# Patient Record
Sex: Male | Born: 1985 | Race: White | Hispanic: No | Marital: Married | Smoking: Never smoker
Health system: Southern US, Community
[De-identification: ages and names within clinical notes are randomized; demographics above are authoritative.]

## PROBLEM LIST (undated history)

## (undated) DIAGNOSIS — Q602 Renal agenesis, unspecified: Secondary | ICD-10-CM

## (undated) DIAGNOSIS — N201 Calculus of ureter: Secondary | ICD-10-CM

## (undated) DIAGNOSIS — K56609 Unspecified intestinal obstruction, unspecified as to partial versus complete obstruction: Secondary | ICD-10-CM

## (undated) DIAGNOSIS — Q600 Renal agenesis, unilateral: Secondary | ICD-10-CM

## (undated) DIAGNOSIS — Z8719 Personal history of other diseases of the digestive system: Secondary | ICD-10-CM

## (undated) DIAGNOSIS — M1990 Unspecified osteoarthritis, unspecified site: Secondary | ICD-10-CM

## (undated) DIAGNOSIS — F419 Anxiety disorder, unspecified: Secondary | ICD-10-CM

## (undated) DIAGNOSIS — Z87898 Personal history of other specified conditions: Secondary | ICD-10-CM

## (undated) DIAGNOSIS — G4733 Obstructive sleep apnea (adult) (pediatric): Secondary | ICD-10-CM

## (undated) HISTORY — PX: NO PAST SURGERIES: SHX2092

## (undated) HISTORY — PX: COLON SURGERY: SHX602

## (undated) HISTORY — DX: Renal agenesis, unspecified: Q60.2

---
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
## 2009-03-14 ENCOUNTER — Emergency Department (HOSPITAL_BASED_OUTPATIENT_CLINIC_OR_DEPARTMENT_OTHER): Admission: EM | Admit: 2009-03-14 | Discharge: 2009-03-14 | Payer: Self-pay | Admitting: Emergency Medicine

## 2010-05-05 LAB — URINALYSIS, ROUTINE W REFLEX MICROSCOPIC
Bilirubin Urine: NEGATIVE
Glucose, UA: NEGATIVE mg/dL
Hgb urine dipstick: NEGATIVE
Ketones, ur: NEGATIVE mg/dL
Nitrite: NEGATIVE
Protein, ur: NEGATIVE mg/dL
Specific Gravity, Urine: 1.019 (ref 1.005–1.030)
Urobilinogen, UA: 1 mg/dL (ref 0.0–1.0)
pH: 6.5 (ref 5.0–8.0)
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
## 2011-10-19 ENCOUNTER — Emergency Department (HOSPITAL_BASED_OUTPATIENT_CLINIC_OR_DEPARTMENT_OTHER): Payer: Managed Care, Other (non HMO)

## 2011-10-19 ENCOUNTER — Emergency Department (HOSPITAL_BASED_OUTPATIENT_CLINIC_OR_DEPARTMENT_OTHER)
Admission: EM | Admit: 2011-10-19 | Discharge: 2011-10-19 | Disposition: A | Discharge status: Home / Self Care | Payer: Managed Care, Other (non HMO) | Attending: Emergency Medicine | Admitting: Emergency Medicine

## 2011-10-19 ENCOUNTER — Encounter (HOSPITAL_BASED_OUTPATIENT_CLINIC_OR_DEPARTMENT_OTHER): Payer: Self-pay | Admitting: Emergency Medicine

## 2011-10-19 DIAGNOSIS — R1011 Right upper quadrant pain: Secondary | ICD-10-CM | POA: Insufficient documentation

## 2011-10-19 DIAGNOSIS — R110 Nausea: Secondary | ICD-10-CM | POA: Insufficient documentation

## 2011-10-19 DIAGNOSIS — R109 Unspecified abdominal pain: Secondary | ICD-10-CM

## 2011-10-19 LAB — COMPREHENSIVE METABOLIC PANEL
ALT: 28 U/L (ref 0–53)
AST: 21 U/L (ref 0–37)
Albumin: 4.5 g/dL (ref 3.5–5.2)
Alkaline Phosphatase: 66 U/L (ref 39–117)
Calcium: 9.7 mg/dL (ref 8.4–10.5)
Potassium: 4.1 mEq/L (ref 3.5–5.1)
Sodium: 139 mEq/L (ref 135–145)
Total Protein: 7.4 g/dL (ref 6.0–8.3)

## 2011-10-19 LAB — CBC WITH DIFFERENTIAL/PLATELET
Basophils Absolute: 0 10*3/uL (ref 0.0–0.1)
Eosinophils Absolute: 0.1 10*3/uL (ref 0.0–0.7)
Eosinophils Relative: 1 % (ref 0–5)
MCH: 31 pg (ref 26.0–34.0)
MCV: 87.8 fL (ref 78.0–100.0)
Neutrophils Relative %: 79 % — ABNORMAL HIGH (ref 43–77)
Platelets: 214 10*3/uL (ref 150–400)
RBC: 4.84 MIL/uL (ref 4.22–5.81)
RDW: 12.1 % (ref 11.5–15.5)
WBC: 9.9 10*3/uL (ref 4.0–10.5)

## 2011-10-19 MED ORDER — GI COCKTAIL ~~LOC~~
30.0000 mL | Freq: Once | ORAL | Status: AC
  Administered 2011-10-19: 30 mL via ORAL
  Filled 2011-10-19: qty 30

## 2011-10-19 MED ORDER — HYDROMORPHONE HCL PF 1 MG/ML IJ SOLN
0.5000 mg | Freq: Once | INTRAMUSCULAR | Status: AC
  Administered 2011-10-19: 1 mg via INTRAVENOUS
  Filled 2011-10-19: qty 1

## 2011-10-19 MED ORDER — ONDANSETRON HCL 4 MG/2ML IJ SOLN
4.0000 mg | Freq: Once | INTRAMUSCULAR | Status: AC
  Administered 2011-10-19: 4 mg via INTRAVENOUS
  Filled 2011-10-19: qty 2

## 2011-10-19 MED ORDER — HYDROMORPHONE HCL PF 1 MG/ML IJ SOLN
0.5000 mg | Freq: Once | INTRAMUSCULAR | Status: AC
  Administered 2011-10-19: 0.5 mg via INTRAVENOUS
  Filled 2011-10-19: qty 1

## 2011-10-19 MED ORDER — OXYCODONE-ACETAMINOPHEN 5-325 MG PO TABS: 1.0000 | ORAL_TABLET | ORAL | Status: AC | PRN

## 2011-10-19 MED ORDER — ONDANSETRON HCL 4 MG PO TABS: 4.0000 mg | ORAL_TABLET | Freq: Four times a day (QID) | ORAL | Status: AC

## 2011-10-19 NOTE — ED Notes (Signed)
RUQ pain intermittently for approximately 2 weeks.  States he has been seen by PMD for this and was not dx'd with anything.  Was put on Nexium but states pain has gotten worse.  Denies dysphagia and reflux.  Admits to early morning pain mostly and today with n/v.

## 2011-10-19 NOTE — ED Notes (Signed)
(+)  murphy sign

## 2011-10-19 NOTE — ED Provider Notes (Signed)
History     CSN: [id number]  Arrival date & time 10/19/11  1027   First MD Initiated Contact with Patient 10/19/11 1208      Chief Complaint  Patient presents with  . Abdominal Pain    (Consider location/radiation/quality/duration/timing/severity/associated sxs/prior treatment) HPI Comments: Patient presents today with the chief complaint of abdominal pain. He states the pain began about a week and a half ago and has gradually gotten worse. He states that the pain will come and go and describes it as sharp. He reports a small amount of diarrhea this morning but denies any other change in bowel habits. He denies any vomiting, melena or hematemesis. He saw his PCP Friday for the pain and is awaiting lab results from their office. He has been taking Nexium since that visit (2 days) without change in symptoms. He reports the pain is worse in the morning and is not associated with eating. He has a past medical history of colon surgery. He denies any history of ulcers. He denies chest pain, shortness of breath, fever and flank pain.  Patient is a 26 y.o. male presenting with abdominal pain. The history is provided by the patient.  Abdominal Pain The primary symptoms of the illness include abdominal pain and nausea. The primary symptoms of the illness do not include fever, shortness of breath, vomiting, diarrhea, hematemesis or hematochezia. The current episode started more than 2 days ago. The problem has been gradually worsening.  The illness is associated with awakening from sleep. The patient has not had a change in bowel habit. Symptoms associated with the illness do not include chills, anorexia or constipation.    History reviewed. No pertinent past medical history.  Past Surgical History  Procedure Date  . Colon surgery     No family history on file.  History  Substance Use Topics  . Smoking status: Never Smoker   . Smokeless tobacco: Not on file  . Alcohol Use: Yes       Review of Systems  Constitutional: Negative for fever, chills, appetite change and unexpected weight change.  Respiratory: Negative for shortness of breath.   Cardiovascular: Negative for chest pain.  Gastrointestinal: Positive for nausea and abdominal pain. Negative for vomiting, diarrhea, constipation, blood in stool, hematochezia, anorexia and hematemesis.  Genitourinary: Negative for flank pain.    Allergies  Morphine and related  Home Medications   Current Outpatient Rx  Name Route Sig Dispense Refill  . ESOMEPRAZOLE MAGNESIUM 40 MG PO CPDR Oral Take 40 mg by mouth daily before breakfast.      BP 129/84  Pulse 68  Temp 98 F (36.7 C) (Oral)  Resp 16  Ht 5\' 5"  (1.651 m)  Wt 160 lb (72.576 kg)  BMI 26.63 kg/m2  SpO2 98%  Physical Exam  Constitutional: He appears well-developed and well-nourished.  Cardiovascular: Normal rate, regular rhythm, normal heart sounds and intact distal pulses.   Pulmonary/Chest: Effort normal.  Abdominal: Soft. Bowel sounds are normal. There is tenderness in the right upper quadrant, right lower quadrant and epigastric area. There is no rebound.    ED Course  Procedures (including critical care time)   Labs Reviewed  CBC WITH DIFFERENTIAL  COMPREHENSIVE METABOLIC PANEL  LIPASE, BLOOD  URINALYSIS, ROUTINE W REFLEX MICROSCOPIC   Results for orders placed during the hospital encounter of 10/19/11  CBC WITH DIFFERENTIAL      Component Value Range   WBC 9.9  4.0 - 10.5 K/uL   RBC 4.84  4.22 -  5.81 MIL/uL   Hemoglobin 15.0  13.0 - 17.0 g/dL   HCT 16.1  09.6 - 04.5 %   MCV 87.8  78.0 - 100.0 fL   MCH 31.0  26.0 - 34.0 pg   MCHC 35.3  30.0 - 36.0 g/dL   RDW 40.9  81.1 - 91.4 %   Platelets 214  150 - 400 K/uL   Neutrophils Relative 79 (*) 43 - 77 %   Neutro Abs 7.8 (*) 1.7 - 7.7 K/uL   Lymphocytes Relative 15  12 - 46 %   Lymphs Abs 1.5  0.7 - 4.0 K/uL   Monocytes Relative 5  3 - 12 %   Monocytes Absolute 0.5  0.1 - 1.0 K/uL    Eosinophils Relative 1  0 - 5 %   Eosinophils Absolute 0.1  0.0 - 0.7 K/uL   Basophils Relative 0  0 - 1 %   Basophils Absolute 0.0  0.0 - 0.1 K/uL  COMPREHENSIVE METABOLIC PANEL      Component Value Range   Sodium 139  135 - 145 mEq/L   Potassium 4.1  3.5 - 5.1 mEq/L   Chloride 101  96 - 112 mEq/L   CO2 27  19 - 32 mEq/L   Glucose, Bld 98  70 - 99 mg/dL   BUN 15  6 - 23 mg/dL   Creatinine, Ser 7.82  0.50 - 1.35 mg/dL   Calcium 9.7  8.4 - 95.6 mg/dL   Total Protein 7.4  6.0 - 8.3 g/dL   Albumin 4.5  3.5 - 5.2 g/dL   AST 21  0 - 37 U/L   ALT 28  0 - 53 U/L   Alkaline Phosphatase 66  39 - 117 U/L   Total Bilirubin 0.5  0.3 - 1.2 mg/dL   GFR calc non Af Amer >90  >90 mL/min   GFR calc Af Amer >90  >90 mL/min  LIPASE, BLOOD      Component Value Range   Lipase 31  11 - 59 U/L   US Abdomen Complete  10/19/2011  *RADIOLOGY REPORT*  Clinical Data:  Right upper quadrant pain.  Congenital absence of the left kidney.  History of colon surgery.  COMPLETE ABDOMINAL ULTRASOUND  Comparison:  None.  Findings:  Gallbladder:  No gallstones, gallbladder wall thickening, or pericholecystic fluid.  Common bile duct:  Suboptimal visualization.  Visualized portion measures 1 mm.  No dilation.  Liver:  No intrahepatic biliary ductal dilation.  No mass lesion. Normal echotexture.  IVC:  Obscured by bowel gas.  Pancreas:  Obscured by bowel gas.  Spleen:  10.3 cm.  Normal echotexture.  Right Kidney:  12.1 cm. Normal echotexture.  Normal central sinus echo complex.  No calculi or hydronephrosis.  Left Kidney:  Congenitally absent.  Abdominal aorta:  Visualized portion measures 21 mm.  IMPRESSION: No acute abnormality.  Congenitally absent left kidney.  Negative for cholelithiasis or cholecystitis.   Original Report Authenticated By: Andreas Newport, M.D.    No results found.   No diagnosis found.    MDM  Patient reports pain improvement pain with dilaudid, still mildly tender to palpation. No abnormal lab  results for CBC,CMP and lipase. No evidence of fever. Ultrasound showed no evidence of cholelithiasis or cholecystitis. VSS, feel stable for discharge. Discussed recheck with primary care this week.        Rodena Medin, PA-C 10/19/11 1434

## 2011-10-20 ENCOUNTER — Ambulatory Visit: Payer: Self-pay | Admitting: Family

## 2011-10-20 ENCOUNTER — Inpatient Hospital Stay (HOSPITAL_BASED_OUTPATIENT_CLINIC_OR_DEPARTMENT_OTHER)
Admission: EM | Admit: 2011-10-20 | Discharge: 2011-10-24 | DRG: 390 | Disposition: A | Discharge status: Home / Self Care | Payer: Managed Care, Other (non HMO) | Attending: General Surgery | Admitting: General Surgery

## 2011-10-20 ENCOUNTER — Emergency Department (HOSPITAL_BASED_OUTPATIENT_CLINIC_OR_DEPARTMENT_OTHER): Payer: Managed Care, Other (non HMO)

## 2011-10-20 ENCOUNTER — Encounter (HOSPITAL_BASED_OUTPATIENT_CLINIC_OR_DEPARTMENT_OTHER): Payer: Self-pay | Admitting: *Deleted

## 2011-10-20 DIAGNOSIS — Z885 Allergy status to narcotic agent status: Secondary | ICD-10-CM

## 2011-10-20 DIAGNOSIS — Z9049 Acquired absence of other specified parts of digestive tract: Secondary | ICD-10-CM

## 2011-10-20 DIAGNOSIS — K56609 Unspecified intestinal obstruction, unspecified as to partial versus complete obstruction: Secondary | ICD-10-CM

## 2011-10-20 HISTORY — DX: Unspecified intestinal obstruction, unspecified as to partial versus complete obstruction: K56.609

## 2011-10-20 LAB — URINALYSIS, ROUTINE W REFLEX MICROSCOPIC
Ketones, ur: 15 mg/dL — AB
Leukocytes, UA: NEGATIVE
Nitrite: NEGATIVE
Protein, ur: NEGATIVE mg/dL
Urobilinogen, UA: 1 mg/dL (ref 0.0–1.0)
pH: 6.5 (ref 5.0–8.0)

## 2011-10-20 LAB — BASIC METABOLIC PANEL
Calcium: 10.2 mg/dL (ref 8.4–10.5)
Creatinine, Ser: 1.1 mg/dL (ref 0.50–1.35)
GFR calc Af Amer: >90 mL/min (ref >90)
GFR calc non Af Amer: >90 mL/min (ref >90)

## 2011-10-20 MED ORDER — PANTOPRAZOLE SODIUM 40 MG IV SOLR: 40.0000 mg | Freq: Every day | INTRAVENOUS | Status: DC

## 2011-10-20 MED ORDER — HYDROMORPHONE HCL PF 1 MG/ML IJ SOLN
0.5000 mg | Freq: Once | INTRAMUSCULAR | Status: AC
  Administered 2011-10-20: 0.5 mg via INTRAVENOUS
  Filled 2011-10-20: qty 1

## 2011-10-20 MED ORDER — HYDROMORPHONE HCL PF 1 MG/ML IJ SOLN
0.5000 mg | INTRAMUSCULAR | Status: DC | PRN
  Administered 2011-10-21 – 2011-10-22 (×6): 0.5 mg via INTRAVENOUS
  Filled 2011-10-20 (×6): qty 1

## 2011-10-20 MED ORDER — LIDOCAINE VISCOUS 2 % MT SOLN
20.0000 mL | Freq: Once | OROMUCOSAL | Status: AC
  Administered 2011-10-20: 20 mL via OROMUCOSAL

## 2011-10-20 MED ORDER — ONDANSETRON HCL 4 MG/2ML IJ SOLN
4.0000 mg | Freq: Four times a day (QID) | INTRAMUSCULAR | Status: DC | PRN
  Administered 2011-10-21 (×2): 4 mg via INTRAVENOUS
  Filled 2011-10-20 (×2): qty 2

## 2011-10-20 MED ORDER — LIDOCAINE VISCOUS 2 % MT SOLN
OROMUCOSAL | Status: AC
  Administered 2011-10-20: 20 mL via OROMUCOSAL
  Filled 2011-10-20: qty 15

## 2011-10-20 MED ORDER — ENOXAPARIN SODIUM 40 MG/0.4ML ~~LOC~~ SOLN
40.0000 mg | SUBCUTANEOUS | Status: DC
  Administered 2011-10-21 – 2011-10-24 (×4): 40 mg via SUBCUTANEOUS
  Filled 2011-10-20 (×5): qty 0.4

## 2011-10-20 MED ORDER — SODIUM CHLORIDE 0.9 % IV BOLUS (SEPSIS)
1000.0000 mL | Freq: Once | INTRAVENOUS | Status: AC
  Administered 2011-10-20: 1000 mL via INTRAVENOUS

## 2011-10-20 MED ORDER — HYDROMORPHONE HCL PF 1 MG/ML IJ SOLN
INTRAMUSCULAR | Status: AC
  Filled 2011-10-20: qty 1

## 2011-10-20 MED ORDER — PANTOPRAZOLE SODIUM 40 MG PO TBEC: 40.0000 mg | DELAYED_RELEASE_TABLET | Freq: Every day | ORAL | Status: DC

## 2011-10-20 MED ORDER — IOHEXOL 300 MG/ML  SOLN
100.0000 mL | Freq: Once | INTRAMUSCULAR | Status: AC | PRN
  Administered 2011-10-20: 100 mL via INTRAVENOUS

## 2011-10-20 MED ORDER — IOHEXOL 300 MG/ML  SOLN: 20.0000 mL | INTRAMUSCULAR | Status: DC

## 2011-10-20 MED ORDER — ONDANSETRON HCL 4 MG PO TABS
4.0000 mg | ORAL_TABLET | Freq: Four times a day (QID) | ORAL | Status: DC
  Administered 2011-10-21: 4 mg via ORAL
  Filled 2011-10-20 (×5): qty 1

## 2011-10-20 MED ORDER — POTASSIUM CHLORIDE 10 MEQ/100ML IV SOLN
10.0000 meq | INTRAVENOUS | Status: AC
  Administered 2011-10-20 – 2011-10-21 (×4): 10 meq via INTRAVENOUS
  Filled 2011-10-20 (×4): qty 100

## 2011-10-20 MED ORDER — KCL IN DEXTROSE-NACL 40-5-0.9 MEQ/L-%-% IV SOLN
INTRAVENOUS | Status: DC
Start: 2011-10-20 — End: 2011-10-24
  Administered 2011-10-21 – 2011-10-24 (×7): via INTRAVENOUS
  Filled 2011-10-20 (×11): qty 1000

## 2011-10-20 MED ORDER — ONDANSETRON HCL 4 MG/2ML IJ SOLN
4.0000 mg | Freq: Once | INTRAMUSCULAR | Status: AC
  Administered 2011-10-20: 4 mg via INTRAVENOUS
  Filled 2011-10-20: qty 2

## 2011-10-20 NOTE — ED Notes (Signed)
Report being called to Patent attorney at [facility]
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
## 2011-10-20 NOTE — ED Notes (Signed)
Pt. Reports last bm was yesterday morning and hard.
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
## 2011-10-20 NOTE — ED Notes (Signed)
Right upper quad pain. Was seen here yesterday for same. Vomiting.

## 2011-10-21 ENCOUNTER — Inpatient Hospital Stay (HOSPITAL_COMMUNITY): Payer: Managed Care, Other (non HMO)

## 2011-10-21 LAB — CBC
HCT: 40.4 % (ref 39.0–52.0)
Hemoglobin: 12.9 g/dL — ABNORMAL LOW (ref 13.0–17.0)
Hemoglobin: 14 g/dL (ref 13.0–17.0)
MCH: 30.1 pg (ref 26.0–34.0)
MCH: 31.3 pg (ref 26.0–34.0)
MCHC: 34.7 g/dL (ref 30.0–36.0)
MCV: 90 fL (ref 78.0–100.0)
MCV: 90.2 fL (ref 78.0–100.0)
RBC: 4.28 MIL/uL (ref 4.22–5.81)
RDW: 12.1 % (ref 11.5–15.5)
WBC: 6.5 10*3/uL (ref 4.0–10.5)

## 2011-10-21 LAB — BASIC METABOLIC PANEL
BUN: 15 mg/dL (ref 6–23)
CO2: 25 mEq/L (ref 19–32)
Chloride: 102 mEq/L (ref 96–112)
Creatinine, Ser: 1.11 mg/dL (ref 0.50–1.35)
Potassium: 4.2 mEq/L (ref 3.5–5.1)

## 2011-10-21 LAB — MAGNESIUM: Magnesium: 2 mg/dL (ref 1.5–2.5)

## 2011-10-21 MED ORDER — ONDANSETRON HCL 4 MG/2ML IJ SOLN: 4.0000 mg | Freq: Four times a day (QID) | INTRAMUSCULAR | Status: DC | PRN

## 2011-10-21 MED ORDER — PANTOPRAZOLE SODIUM 40 MG PO PACK
40.0000 mg | PACK | Freq: Every day | ORAL | Status: DC
  Filled 2011-10-21: qty 20

## 2011-10-21 MED ORDER — PANTOPRAZOLE SODIUM 40 MG IV SOLR
40.0000 mg | Freq: Two times a day (BID) | INTRAVENOUS | Status: DC
  Administered 2011-10-21 – 2011-10-24 (×6): 40 mg via INTRAVENOUS
  Filled 2011-10-21 (×9): qty 40

## 2011-10-21 NOTE — Care Management Note (Signed)
  Page 1 of 1   10/21/2011     11:43:44 AM   CARE MANAGEMENT NOTE 10/21/2011  Patient:  Duane Oneal, Duane Oneal   Account Number:  [id number]  Date Initiated:  10/21/2011  Documentation initiated by:  Ronny Flurry  Subjective/Objective Assessment:   DX: SBO     Action/Plan:   NGT and IVF   Anticipated DC Date:  10/24/2011   Anticipated DC Plan:  HOME/SELF CARE         Choice offered to / List presented to:             Status of service:   Medicare Important Message given?   (If response is "NO", the following Medicare IM given date fields will be blank) Date Medicare IM given:   Date Additional Medicare IM given:    Discharge Disposition:    Per UR Regulation:  Reviewed for med. necessity/level of care/duration of stay  If discussed at Long Length of Stay Meetings, dates discussed:    Comments:

## 2011-10-21 NOTE — Progress Notes (Signed)
Subjective: Patient states he is feeling much better. No nausea, pain improved. +flatus. NG output 700cc bilious. Abd 2view Xray shows contrast media in colon  Objective: Vital signs in last 24 hours: Temp:  [97.6 F (36.4 C)-99.7 F (37.6 C)] 97.8 F (36.6 C) (09/04 0512) Pulse Rate:  [94-118] 94  (09/04 0512) Resp:  [16-24] 18  (09/04 0512) BP: (98-132)/(47-73) 108/58 mmHg (09/04 0512) SpO2:  [93 %-100 %] 97 % (09/04 0512) Weight:  [168 lb (76.204 kg)] 168 lb (76.204 kg) (09/03 2100) Last BM Date: 10/20/11  Intake/Output from previous day: 09/03 0701 - 09/04 0700 In: 1111 [I.V.:618; IV Piggyback:493] Out: 1650 [Urine:950; Emesis/NG output:700] Intake/Output this shift:   Gen: In bed, NAD. Talkative. NGT in place Heart: RRR Lungs: CTAB Laterally Abd: Well healed midline scar. Nontender, does not appear distended. +BS  Lab Results:   Basename 10/21/11 0500 10/21/11 0020  WBC 6.5 9.7  HGB 12.9* 14.0  HCT 38.5* 40.4  PLT 195 201   BMET  Basename 10/21/11 0500 10/21/11 0020 10/20/11 1650  NA 135 -- 138  K 4.2 -- 3.4*  CL 102 -- 99  CO2 25 -- 21  GLUCOSE 116* -- 136*  BUN 15 -- 20  CREATININE 1.11 1.08 --  CALCIUM 8.6 -- 10.2   Studies/Results: US Abdomen Complete  10/19/2011  *RADIOLOGY REPORT*  Clinical Data:  Right upper quadrant pain.  Congenital absence of the left kidney.  History of colon surgery.  COMPLETE ABDOMINAL ULTRASOUND  Comparison:  None.  Findings:  Gallbladder:  No gallstones, gallbladder wall thickening, or pericholecystic fluid.  Common bile duct:  Suboptimal visualization.  Visualized portion measures 1 mm.  No dilation.  Liver:  No intrahepatic biliary ductal dilation.  No mass lesion. Normal echotexture.  IVC:  Obscured by bowel gas.  Pancreas:  Obscured by bowel gas.  Spleen:  10.3 cm.  Normal echotexture.  Right Kidney:  12.1 cm. Normal echotexture.  Normal central sinus echo complex.  No calculi or hydronephrosis.  Left Kidney:  Congenitally  absent.  Abdominal aorta:  Visualized portion measures 21 mm.  IMPRESSION: No acute abnormality.  Congenitally absent left kidney.  Negative for cholelithiasis or cholecystitis.   Original Report Authenticated By: Andreas Newport, M.D.    Ct Abdomen Pelvis W Contrast  10/20/2011  *RADIOLOGY REPORT*  Clinical Data: Constipation.  Abdominal pain with vomiting. Congenital solitary kidney.  History of small bowel and colon surgery 10 years ago.  CT ABDOMEN AND PELVIS WITH CONTRAST  Technique:  Multidetector CT imaging of the abdomen and pelvis was performed following the standard protocol during bolus administration of intravenous contrast.  Contrast: OMNIPAQUE IOHEXOL 300 MG/ML  SOLN  Comparison: None.  Findings: In this patient who has a prior bowel wall surgery, there are dilated small bowel loops involving portions of the mid/distal jejunum and proximal ileum consistent with small bowel obstruction. Cause is indeterminate possibly related to adhesions.  No free intraperitoneal air or free fluid.  Solitary kidney.  No focal hepatic, splenic, pancreatic, right renal or adrenal lesion.  No calcified gallstones.  No abdominal aortic aneurysm.  Kyphosis L2-3 level with irregularity of the L3 superior endplate may be related to degenerative changes/prior trauma.  Infection felt to be a secondary less likely consideration unless of high clinical concern.  If infection were of concern then MR may be considered.  IMPRESSION: In this patient who has a prior bowel wall surgery, there are dilated small bowel loops involving portions of the mid/distal jejunum  and proximal ileum consistent with small bowel obstruction. Cause is indeterminate possibly related to adhesions.  Probable degenerative changes contribute to kyphosis at the L2-3 level as noted above.  Solitary right kidney.  This has been made a PRA call report utilizing dashboard call feature.   Original Report Authenticated By: Fuller Canada, M.D.    Dg Abd  Portable 2v  10/21/2011  *RADIOLOGY REPORT*  Clinical Data: Small bowel obstruction.  Abdominal pain.  PORTABLE ABDOMEN - 2 VIEW  Comparison: CT 10/20/2011  Findings: Nasogastric tube has its tip just in the stomach.  There are a few mildly dilated contrast and air filled loops of small intestine, but the plain radiographic pattern is not to markedly abnormal.  An anastomotic sutures are noted in the left upper quadrant.  Previously administered oral contrast is now largely present within the colon.  No free air.  IMPRESSION: A few mildly dilated loops of small intestine in the central abdomen.  Previously administered contrast has passed into the colon.   Original Report Authenticated By: Thomasenia Sales, M.D.    Anti-infectives: Anti-infectives    None     Assessment/Plan: 26 yo M p/w SBO s/p bowel resection 10+ years ago  1. Continue NGT to intermittent suction. Monitor output. Will plan to try clamping trials tomorrow if patient continues to feel well 2. Continue NPO except ice chips for now 3. Pain control with Dilaudid while NPO 4. Zofran prn nausea 5. Will check AM labs 6. Dispo pending further clinical improvement, including tolerating PO, pain control and +BM.   LOS: 1 day   [place], AMBER 10/21/2011 He feels much better today.  Dark output from NG but minimal pain, no fevers or leukocytosis.  Will continue nonop management for now.

## 2011-10-21 NOTE — ED Provider Notes (Signed)
Medical screening examination/treatment/procedure(s) were performed by non-physician practitioner and as supervising physician I was immediately available for consultation/collaboration.  Jones Skene, M.D.     Jones Skene, MD 10/21/11 1111

## 2011-10-22 ENCOUNTER — Inpatient Hospital Stay (HOSPITAL_COMMUNITY): Payer: Managed Care, Other (non HMO)

## 2011-10-22 LAB — CBC
HCT: 41.6 % (ref 39.0–52.0)
Hemoglobin: 14.5 g/dL (ref 13.0–17.0)
MCV: 89.3 fL (ref 78.0–100.0)
RDW: 12.2 % (ref 11.5–15.5)
WBC: 6.7 10*3/uL (ref 4.0–10.5)

## 2011-10-22 LAB — COMPREHENSIVE METABOLIC PANEL
Albumin: 3.6 g/dL (ref 3.5–5.2)
Alkaline Phosphatase: 61 U/L (ref 39–117)
BUN: 7 mg/dL (ref 6–23)
Chloride: 100 mEq/L (ref 96–112)
Creatinine, Ser: 1.06 mg/dL (ref 0.50–1.35)
GFR calc Af Amer: >90 mL/min (ref >90)
GFR calc non Af Amer: >90 mL/min (ref >90)
Glucose, Bld: 112 mg/dL — ABNORMAL HIGH (ref 70–99)
Potassium: 4 mEq/L (ref 3.5–5.1)
Total Bilirubin: 0.6 mg/dL (ref 0.3–1.2)

## 2011-10-22 NOTE — Progress Notes (Signed)
He feels better and is now tolerating some clears.  Will try to advance diet as tolerated and see if he continues to improve.

## 2011-10-22 NOTE — Progress Notes (Signed)
Patient ID: Duane Oneal, male   DOB: [birth year], 26 y.o.   MRN: [id number]    Subjective: Pt feels better, denies nausea or vomiting, minimal pain in last 12 hours, passing lots of gas, no bm yet  Objective: Vital signs in last 24 hours: Temp:  [97.4 F (36.3 C)-98.2 F (36.8 C)] 98 F (36.7 C) (09/05 0215) Pulse Rate:  [73-82] 73  (09/05 0215) Resp:  [18-20] 18  (09/05 0215) BP: (116-121)/(59-72) 116/59 mmHg (09/05 0215) SpO2:  [94 %-98 %] 98 % (09/05 0215) Last BM Date: 10/20/11  Intake/Output from previous day: 09/04 0701 - 09/05 0700 In: 1611.7 [I.V.:1611.7] Out: 2750 [Urine:1300; Emesis/NG output:1450] Intake/Output this shift:    Physical Exam: Gen: alwake and alert, NAD, NGT in and on suction Heart: RRR Lungs: CTA bil Abd: soft, nontender, +bs  Lab Results:   Basename 10/22/11 0745 10/21/11 0500  WBC 6.7 6.5  HGB 14.5 12.9*  HCT 41.6 38.5*  PLT 194 195   BMET  Basename 10/22/11 0745 10/21/11 0500  NA 136 135  K 4.0 4.2  CL 100 102  CO2 28 25  GLUCOSE 112* 116*  BUN 7 15  CREATININE 1.06 1.11  CALCIUM 9.3 8.6   Studies/Results: Ct Abdomen Pelvis W Contrast  10/20/2011  *RADIOLOGY REPORT*  Clinical Data: Constipation.  Abdominal pain with vomiting. Congenital solitary kidney.  History of small bowel and colon surgery 10 years ago.  CT ABDOMEN AND PELVIS WITH CONTRAST  Technique:  Multidetector CT imaging of the abdomen and pelvis was performed following the standard protocol during bolus administration of intravenous contrast.  Contrast: OMNIPAQUE IOHEXOL 300 MG/ML  SOLN  Comparison: None.  Findings: In this patient who has a prior bowel wall surgery, there are dilated small bowel loops involving portions of the mid/distal jejunum and proximal ileum consistent with small bowel obstruction. Cause is indeterminate possibly related to adhesions.  No free intraperitoneal air or free fluid.  Solitary kidney.  No focal hepatic, splenic, pancreatic, right renal  or adrenal lesion.  No calcified gallstones.  No abdominal aortic aneurysm.  Kyphosis L2-3 level with irregularity of the L3 superior endplate may be related to degenerative changes/prior trauma.  Infection felt to be a secondary less likely consideration unless of high clinical concern.  If infection were of concern then MR may be considered.  IMPRESSION: In this patient who has a prior bowel wall surgery, there are dilated small bowel loops involving portions of the mid/distal jejunum and proximal ileum consistent with small bowel obstruction. Cause is indeterminate possibly related to adhesions.  Probable degenerative changes contribute to kyphosis at the L2-3 level as noted above.  Solitary right kidney.  This has been made a PRA call report utilizing dashboard call feature.   Original Report Authenticated By: Fuller Canada, M.D.    Dg Abd 2 Views  10/22/2011  *RADIOLOGY REPORT*  Clinical Data: Follow-up small bowel obstruction.  Some abdominal pain  ABDOMEN - 2 VIEW  Comparison: 10/21/2011  Findings: The nasogastric tube is in place with the tip overlying the mid body of the stomach. Anastomotic suture lines are identified in both upper mid quadrants.  No visualized small bowel gas is noted with gas identified throughout the colon to the level of the rectum. New gas is identified in the region of the cecum and may represent progression of previously noted small bowel gas. Colonic contrast remains in the descending and rectosigmoid portions of the colon.  IMPRESSION: No gas-filled small bowel loops are  seen to suggest residual ileus or small bowel obstruction.   Original Report Authenticated By: Bertha Stakes, M.D.    Dg Abd Portable 2v  10/21/2011  *RADIOLOGY REPORT*  Clinical Data: Small bowel obstruction.  Abdominal pain.  PORTABLE ABDOMEN - 2 VIEW  Comparison: CT 10/20/2011  Findings: Nasogastric tube has its tip just in the stomach.  There are a few mildly dilated contrast and air filled loops of  small intestine, but the plain radiographic pattern is not to markedly abnormal.  An anastomotic sutures are noted in the left upper quadrant.  Previously administered oral contrast is now largely present within the colon.  No free air.  IMPRESSION: A few mildly dilated loops of small intestine in the central abdomen.  Previously administered contrast has passed into the colon.   Original Report Authenticated By: Thomasenia Sales, M.D.    Anti-infectives: Anti-infectives    None     Assessment/Plan: 26 yo M p/w SBO s/p bowel resection 10+ years ago  1. Abd x-ray today shows no ileus or SBO- seems to be resolved, will clamp NGT and give fluids then plan to d/c it later today.  If doing well can likely go home in am.  Repeat films in AM.   LOS: 2 days   [name] 10/22/2011

## 2011-10-23 NOTE — ED Provider Notes (Signed)
Medical screening examination/treatment/procedure(s) were performed by non-physician practitioner and as supervising physician I was immediately available for consultation/collaboration.   Jovonda Selner M Luma Clopper, MD 10/23/11 2202 

## 2011-10-23 NOTE — Progress Notes (Signed)
Tolerating diet and feeling better.  Should be okay for discharge soon

## 2011-10-23 NOTE — Progress Notes (Signed)
Cosigned for [employer] RN assessment, I/O, note(s), med administration and care plan/education.  Lorretta Harp RN

## 2011-10-23 NOTE — Discharge Summary (Signed)
  Physician Discharge Summary  Patient ID: Duane Oneal MRN: [id number] DOB/AGE: 1986/02/11 26 y.o.  Admit date: 10/20/2011 Discharge date: 10/23/2011  Admitting Diagnosis: SBO  Discharge Diagnosis There are no active problems to display for this patient.   Consultants None  Procedures None  Hospital Course:  26 yr old male who presented to [facility] with abdominal pain, nausea, and vomiting.  Work-up showed a SBO.  He was admitted and a NGT was placed.  He was NPO for 24 hours and began to feel better.  His abdominal films also improved and the NGT was clamped and then removed when he tolerate clamping for 4 hours.  His diet was advanced as tolerated and he tolerated this well.  He was stable for discharge.   Medication List  As of 10/23/2011  9:37 AM   ASK your doctor about these medications         esomeprazole 40 MG capsule   Commonly known as: NEXIUM   Inject 40 mg into the vein daily before breakfast.      ondansetron 4 MG tablet   Commonly known as: ZOFRAN   Take 1 tablet (4 mg total) by mouth every 6 (six) hours.      oxyCODONE-acetaminophen 5-325 MG per tablet   Commonly known as: PERCOCET/ROXICET   Take 1 tablet by mouth every 4 (four) hours as needed for pain.             Follow-up Information    Please follow up. (As needed)          Signed: Clance Boll, [facility] [phone]  10/23/2011, 9:37 AM

## 2011-10-23 NOTE — Discharge Instructions (Signed)
Intestinal Obstruction An intestinal obstruction comes from a physical blockage in the bowel. Different problems in the bowel may cause these blockages.  CAUSES   Adhesions from previous surgeries   Cancer or tumor   A hernia, which is a condition in which a portion of the bowel bulges out through an opening or weakness in the abdomen (belly). This sometimes squeezes the bowel.   A swallowed object.   Blockage (impaction) with worms is common in third world countries.   A twisting of the bowel or telescoping of a portion of the bowel into another portion (intussusceptions)   Anything that stops food from going through from the stomach to the anus.  SYMPTOMS  Symptoms of bowel obstruction may result in your belly getting bigger (bloating), nausea, vomiting, explosive diarrhea, explosive stool. You may not be able to hear your normal bowel sounds (such as "growling in your stomach"). You may also stop having bowel movements or passing gas. DIAGNOSIS  Usually this condition is diagnosed with a history and an examination. Often, lab studies (blood work) and x-rays may be used to find the cause. TREATMENT  The main treatment of this condition is to rest the intestine (gut). Often times the obstruction may relieve itself and allow the gut to start working again. Think of the gut like a balloon that is blown up (filled with trapped food and water) which has squeezed into a hole or area which it cannot get through.   If the obstruction is complete, an NG tube (nasogastric) is passed through the nose and into the stomach. It is then connected to suction to keep the stomach emptied out. This also helps treat the nausea and vomiting.   If there is an imbalance in the electrolytes, they are corrected with intravenous fluids. These have the proper chemicals in them to correct the problem.   If the reason for the obstruction (blockage) does not get better with conservative (non-surgical) treatment,  surgery may be necessary. Sometimes, surgery is done immediately if your surgeon knows that the problem is not going to get better with conservative treatment.  PROGNOSIS  Depending on what the problem is, most of these problems can be treated by your caregivers with good results. Your surgeon will discuss the best course of action to take, with you or your loved ones. FOLLOWING SURGERY Seek immediate medical attention if you have:  Increasing abdominal pain, repeated vomiting, dehydration or fainting.   Severe weakness, chest pain or back pain.   Blood in your vomit, stool or you have tarry stool.  Document Released: 04/25/2003 Document Revised: 01/22/2011 Document Reviewed: 09/23/2007 Exit[employer] Patient Information 2012 [employer]. 

## 2011-10-23 NOTE — Progress Notes (Signed)
Patient ID: Duane Oneal, male   DOB: Sep 14, 1985, 26 y.o.   MRN: [id number]  Subjective: Pt doing well, denies pain, nausea, vomiting, feels hungry.  Objective: Vital signs in last 24 hours: Temp:  [97.5 F (36.4 C)-97.6 F (36.4 C)] 97.5 F (36.4 C) (09/06 0639) Pulse Rate:  [57-76] 57  (09/06 0639) Resp:  [20] 20  (09/06 0639) BP: (95-126)/(53-70) 101/53 mmHg (09/06 0639) SpO2:  [98 %-99 %] 99 % (09/06 0639) Last BM Date: November 13, 2011  Intake/Output from previous day: November 13, 2022 0701 - 09/06 0700 In: 1631.7 [I.V.:1631.7] Out: 400 [Urine:400] Intake/Output this shift:    Physical Exam: Gen: alwake and alert, NAD,  Heart: RRR Lungs: CTA bil Abd: soft, nontender, +bs  Lab Results:   Basename Nov 13, 2011 0745 10/21/11 0500  WBC 6.7 6.5  HGB 14.5 12.9*  HCT 41.6 38.5*  PLT 194 195   BMET  Basename 11/13/11 0745 10/21/11 0500  NA 136 135  K 4.0 4.2  CL 100 102  CO2 28 25  GLUCOSE 112* 116*  BUN 7 15  CREATININE 1.06 1.11  CALCIUM 9.3 8.6   Studies/Results: Dg Abd 2 Views  11/13/11  *RADIOLOGY REPORT*  Clinical Data: Follow-up small bowel obstruction.  Some abdominal pain  ABDOMEN - 2 VIEW  Comparison: 10/21/2011  Findings: The nasogastric tube is in place with the tip overlying the mid body of the stomach. Anastomotic suture lines are identified in both upper mid quadrants.  No visualized small bowel gas is noted with gas identified throughout the colon to the level of the rectum. New gas is identified in the region of the cecum and may represent progression of previously noted small bowel gas. Colonic contrast remains in the descending and rectosigmoid portions of the colon.  IMPRESSION: No gas-filled small bowel loops are seen to suggest residual ileus or small bowel obstruction.   Original Report Authenticated By: Bertha Stakes, M.D.    Anti-infectives: Anti-infectives    None     Assessment/Plan: 26 yo M p/w SBO s/p bowel resection 10+ years ago  1. Seems to be  resolved, advance diet as tolerated, likely d/c tomorrow if tolerating diet   LOS: 3 days   Haili Donofrio 10/23/2011

## 2011-10-24 ENCOUNTER — Encounter (HOSPITAL_COMMUNITY): Payer: Self-pay | Admitting: Surgery

## 2011-10-24 DIAGNOSIS — K56609 Unspecified intestinal obstruction, unspecified as to partial versus complete obstruction: Secondary | ICD-10-CM

## 2011-10-24 HISTORY — DX: Unspecified intestinal obstruction, unspecified as to partial versus complete obstruction: K56.609

## 2011-10-24 NOTE — Progress Notes (Signed)
  Subjective: He feels well and ready to go home. No nausea. Tolerating diet. Bowels working. Minimal pain.  Objective: Vital signs in last 24 hours: Temp:  [97.2 F (36.2 C)-98.4 F (36.9 C)] 97.2 F (36.2 C) (09/07 0544) Pulse Rate:  [50-69] 50  (09/07 0544) Resp:  [18-20] 18  (09/07 0544) BP: (120-127)/(49-64) 120/49 mmHg (09/07 0544) SpO2:  [99 %-100 %] 100 % (09/07 0544)   Intake/Output from previous day: 09/06 0701 - 09/07 0700 In: 1968.3 [P.O.:720; I.V.:1248.3] Out: -  Intake/Output this shift: Total I/O In: 647 [I.V.:647] Out: -    General appearance: alert, cooperative and no distress Resp: clear to auscultation bilaterally Cardio: regular rate and rhythm, S1, S2 normal, no murmur, click, rub or gallop GI: soft, non-tender; bowel sounds normal; no masses,  no organomegaly  Incision: No incision  Lab Results:   Basename 10/22/11 0745  WBC 6.7  HGB 14.5  HCT 41.6  PLT 194   BMET  Basename 10/22/11 0745  NA 136  K 4.0  CL 100  CO2 28  GLUCOSE 112*  BUN 7  CREATININE 1.06  CALCIUM 9.3   PT/INR No results found for this basename: LABPROT:2,INR:2 in the last 72 hours ABG No results found for this basename: PHART:2,PCO2:2,PO2:2,HCO3:2 in the last 72 hours  MEDS, Scheduled    . enoxaparin  40 mg Subcutaneous Q24H  . pantoprazole (PROTONIX) IV  40 mg Intravenous Q12H    Studies/Results: No results found.  Assessment: s/p  Patient Active Problem List  Diagnosis  . Small bowel obstruction    Resolved SBO; ready to discharge  Plan: Discharge   LOS: 4 days     Currie Paris, MD, [facility], [place] [phone]   10/24/2011 10:05 AM

## 2011-12-11 NOTE — ED Provider Notes (Signed)
Medical screening examination/treatment/procedure(s) were performed by non-physician practitioner and as supervising physician I was immediately available for consultation/collaboration.  Jones Skene, M.D.     Jones Skene, MD 12/11/11 1047

## 2012-08-13 ENCOUNTER — Emergency Department (HOSPITAL_BASED_OUTPATIENT_CLINIC_OR_DEPARTMENT_OTHER)
Admission: EM | Admit: 2012-08-13 | Discharge: 2012-08-13 | Disposition: A | Discharge status: Home / Self Care | Payer: Managed Care, Other (non HMO) | Attending: Emergency Medicine | Admitting: Emergency Medicine

## 2012-08-13 ENCOUNTER — Encounter (HOSPITAL_BASED_OUTPATIENT_CLINIC_OR_DEPARTMENT_OTHER): Payer: Self-pay | Admitting: *Deleted

## 2012-08-13 DIAGNOSIS — Z79899 Other long term (current) drug therapy: Secondary | ICD-10-CM | POA: Insufficient documentation

## 2012-08-13 DIAGNOSIS — R21 Rash and other nonspecific skin eruption: Secondary | ICD-10-CM | POA: Insufficient documentation

## 2012-08-13 DIAGNOSIS — L299 Pruritus, unspecified: Secondary | ICD-10-CM | POA: Insufficient documentation

## 2012-08-13 DIAGNOSIS — Z8719 Personal history of other diseases of the digestive system: Secondary | ICD-10-CM | POA: Insufficient documentation

## 2012-08-13 MED ORDER — CLOTRIMAZOLE 1 % EX CREA: TOPICAL_CREAM | CUTANEOUS | Status: DC

## 2012-08-13 NOTE — ED Provider Notes (Signed)
   History    CSN: [id number] Arrival date & time 08/13/12  1204  First MD Initiated Contact with Patient 08/13/12 1218     Chief Complaint  Patient presents with  . Rash   (Consider location/radiation/quality/duration/timing/severity/associated sxs/prior Treatment) HPI Duane Oneal is a 27 y.o. male who presents to ED with complaint of rash and itching to the genital area. States rash started 2 days ago after he shaved. States rash is itchy, denies pain. Recent new sexual partner. States bumps are over the pubic area as well as his penis. Tried I&D ointment this morning with no relief. Denies any fever, chills, no urinary symptoms, no penile discharge. States works in heat and is constantly sweating.   Past Medical History  Diagnosis Date  . Small bowel obstruction 10/24/2011   Past Surgical History  Procedure Laterality Date  . Colon surgery     No family history on file. History  Substance Use Topics  . Smoking status: Never Smoker   . Smokeless tobacco: Not on file  . Alcohol Use: Yes    Review of Systems  Constitutional: Negative for fever and chills.  HENT: Negative for neck pain and neck stiffness.   Genitourinary: Positive for genital sores. Negative for dysuria, discharge, penile swelling, scrotal swelling, penile pain and testicular pain.  Skin: Positive for rash.    Allergies  Morphine and related  Home Medications   Current Outpatient Rx  Name  Route  Sig  Dispense  Refill  . esomeprazole (NEXIUM) 40 MG capsule   Intravenous   Inject 40 mg into the vein daily before breakfast.           BP 130/62  Pulse 90  Temp(Src) 97.8 F (36.6 C) (Oral)  Resp 16  Ht 5\' 6"  (1.676 m)  Wt 170 lb (77.111 kg)  BMI 27.45 kg/m2  SpO2 100% Physical Exam  Vitals reviewed. Constitutional: He appears well-developed and well-nourished. No distress.  Genitourinary:  Erythematous papules with surrounding redness to the shaved pubic area, and shaft of the penis. Normal  scrotum. Rash is non tender. No ulcerations or vesicles. No drainage. No penile discharge.     ED Course  Procedures (including critical care time) Labs Reviewed - No data to display No results found.  1. Rash of perineum     MDM  Pt with itchy papular erythematous rash to the genital area. GC/chlamydia obtained and sent. No vescicles or ulcerations, doubt herpes, doubt syphillis, however, instructed to get full testing done at health department. Suspect heat dermatitis vs tinea cruris. Will start on topical antifungal. Instructed to keep area dry, loose underwear, follow up with PCP.  Filed Vitals:   08/13/12 1212  BP: 130/62  Pulse: 90  Temp: 97.8 F (36.6 C)  TempSrc: Oral  Resp: 16  Height: 5\' 6"  (1.676 m)  Weight: 170 lb (77.111 kg)  SpO2: 100%     Brylyn Novakovich A [place] Zou, PA-C 08/13/12 1302

## 2012-08-13 NOTE — ED Notes (Addendum)
Patient has a rash in his groin area. States that it is itching and raised. Does work outside so hes unsure if it may be a heat rash. Also recently shaved the area and used a different shaving cream.States that he also has a new sex partner.

## 2012-08-13 NOTE — ED Provider Notes (Signed)
Medical screening examination/treatment/procedure(s) were performed by non-physician practitioner and as supervising physician I was immediately available for consultation/collaboration.  Yuleni Burich, MD 08/13/12 1640 

## 2012-08-14 LAB — GC/CHLAMYDIA PROBE AMP: GC Probe RNA: NEGATIVE

## 2012-08-31 ENCOUNTER — Emergency Department (HOSPITAL_BASED_OUTPATIENT_CLINIC_OR_DEPARTMENT_OTHER)
Admission: EM | Admit: 2012-08-31 | Discharge: 2012-08-31 | Disposition: A | Discharge status: Home / Self Care | Payer: Managed Care, Other (non HMO) | Attending: Emergency Medicine | Admitting: Emergency Medicine

## 2012-08-31 ENCOUNTER — Encounter (HOSPITAL_BASED_OUTPATIENT_CLINIC_OR_DEPARTMENT_OTHER): Payer: Self-pay

## 2012-08-31 ENCOUNTER — Emergency Department (HOSPITAL_BASED_OUTPATIENT_CLINIC_OR_DEPARTMENT_OTHER): Payer: Managed Care, Other (non HMO)

## 2012-08-31 DIAGNOSIS — R0602 Shortness of breath: Secondary | ICD-10-CM | POA: Insufficient documentation

## 2012-08-31 DIAGNOSIS — R42 Dizziness and giddiness: Secondary | ICD-10-CM | POA: Insufficient documentation

## 2012-08-31 DIAGNOSIS — R0789 Other chest pain: Secondary | ICD-10-CM | POA: Insufficient documentation

## 2012-08-31 DIAGNOSIS — F411 Generalized anxiety disorder: Secondary | ICD-10-CM | POA: Insufficient documentation

## 2012-08-31 DIAGNOSIS — Z8719 Personal history of other diseases of the digestive system: Secondary | ICD-10-CM | POA: Insufficient documentation

## 2012-08-31 DIAGNOSIS — Z79899 Other long term (current) drug therapy: Secondary | ICD-10-CM | POA: Insufficient documentation

## 2012-08-31 HISTORY — DX: Anxiety disorder, unspecified: F41.9

## 2012-08-31 LAB — COMPREHENSIVE METABOLIC PANEL
ALT: 28 U/L (ref 0–53)
AST: 19 U/L (ref 0–37)
Alkaline Phosphatase: 68 U/L (ref 39–117)
CO2: 24 mEq/L (ref 19–32)
Chloride: 101 mEq/L (ref 96–112)
GFR calc non Af Amer: >90 mL/min (ref >90)
Glucose, Bld: 108 mg/dL — ABNORMAL HIGH (ref 70–99)
Potassium: 3.5 mEq/L (ref 3.5–5.1)
Sodium: 139 mEq/L (ref 135–145)
Total Bilirubin: 1.1 mg/dL (ref 0.3–1.2)

## 2012-08-31 LAB — CBC WITH DIFFERENTIAL/PLATELET
Basophils Absolute: 0 10*3/uL (ref 0.0–0.1)
Lymphocytes Relative: 15 % (ref 12–46)
Lymphs Abs: 1.2 10*3/uL (ref 0.7–4.0)
Neutro Abs: 6.3 10*3/uL (ref 1.7–7.7)
Platelets: 231 10*3/uL (ref 150–400)
RBC: 4.92 MIL/uL (ref 4.22–5.81)
RDW: 12.1 % (ref 11.5–15.5)
WBC: 8.1 10*3/uL (ref 4.0–10.5)

## 2012-08-31 NOTE — ED Notes (Signed)
Pt reports intermittent central chest pain, dizziness, and anxiety  that started this morning at 0500 and continued throughout the morning while at work.  States he has increased stress.

## 2012-08-31 NOTE — ED Provider Notes (Signed)
History    CSN: [id number] Arrival date & time 08/31/12  1027  First MD Initiated Contact with Patient 08/31/12 1102     Chief Complaint  Patient presents with  . Chest Pain   (Consider location/radiation/quality/duration/timing/severity/associated sxs/prior Treatment) HPI Comments: Patient woke from sleep at [facility] with an episode of sharp pain to the center of his chest.  This lasted for a few minutes.  He got ready and went to work, then experienced additional episodes of this.  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  He has felt dizzy and short of breath as well this morning.  He denies any prior cardiac history and denies any recent exertional symptoms.  He has no risk factors.  Patient is a 27 y.o. male presenting with chest pain. The history is provided by the patient.  Chest Pain Pain location:  Substernal area Pain quality: tightness   Pain radiates to:  Does not radiate Pain severity:  Moderate Onset quality:  Sudden Timing:  Intermittent Progression:  Unchanged Chronicity:  New Context: not breathing and no movement   Relieved by:  Nothing Worsened by:  Nothing tried Ineffective treatments:  None tried Associated symptoms: no abdominal pain, no cough and no shortness of breath    Past Medical History  Diagnosis Date  . Small bowel obstruction 10/24/2011  . Anxiety    Past Surgical History  Procedure Laterality Date  . Colon surgery     No family history on file. History  Substance Use Topics  . Smoking status: Never Smoker   . Smokeless tobacco: Not on file  . Alcohol Use: Yes     Comment: occasional    Review of Systems  Respiratory: Negative for cough and shortness of breath.   Cardiovascular: Positive for chest pain.  Gastrointestinal: Negative for abdominal pain.  All other systems reviewed and are negative.    Allergies  Morphine and related  Home Medications   Current Outpatient Rx  Name  Route  Sig  Dispense  Refill   . LORazepam (ATIVAN) 0.5 MG tablet   Oral   Take 0.5 mg by mouth every 6 (six) hours as needed for anxiety.          BP 139/95  Pulse 94  Temp(Src) 98 F (36.7 C) (Oral)  Resp 18  SpO2 100% Physical Exam  Nursing note and vitals reviewed. Constitutional: He is oriented to person, place, and time. He appears well-developed and well-nourished. No distress.  HENT:  Head: Normocephalic and atraumatic.  Mouth/Throat: Oropharynx is clear and moist.  Neck: Normal range of motion. Neck supple.  Cardiovascular: Normal rate, regular rhythm and normal heart sounds.   No murmur heard. Pulmonary/Chest: Effort normal and breath sounds normal. No respiratory distress. He has no wheezes.  Abdominal: Soft. Bowel sounds are normal. He exhibits no distension. There is no tenderness.  Musculoskeletal: Normal range of motion. He exhibits no edema.  Neurological: He is alert and oriented to person, place, and time.  Skin: Skin is warm and dry. He is not diaphoretic.    ED Course  Procedures (including critical care time) Labs Reviewed  CBC WITH DIFFERENTIAL - Abnormal; Notable for the following:    Neutrophils Relative % 78 (*)    All other components within normal limits  COMPREHENSIVE METABOLIC PANEL  TROPONIN I   No results found. No diagnosis found.   Date: 08/31/2012  Rate: 87  Rhythm: normal sinus rhythm  QRS Axis: normal  Intervals: normal  ST/T Wave abnormalities: normal  Conduction Disutrbances:none  Narrative Interpretation:   Old EKG Reviewed: none available    MDM  The patient presents with complaints of chest discomfort that started at [facility].  The symptoms are atypical for cardiac pain and the workup including ekg and troponin and chest xray were unremarkable.  He is feeling better.  I do not feel as though further workup is indicated at this time.  He was advised to rest, follow up with pcp in the next week, return prn if he worsens.   Geoffery Lyons, MD 08/31/12  [phone]

## 2012-08-31 NOTE — ED Notes (Signed)
Patient transported to X-ray 

## 2013-05-04 ENCOUNTER — Emergency Department (HOSPITAL_BASED_OUTPATIENT_CLINIC_OR_DEPARTMENT_OTHER)
Admission: EM | Admit: 2013-05-04 | Discharge: 2013-05-04 | Disposition: A | Discharge status: Home / Self Care | Payer: Managed Care, Other (non HMO) | Attending: Emergency Medicine | Admitting: Emergency Medicine

## 2013-05-04 ENCOUNTER — Emergency Department (HOSPITAL_BASED_OUTPATIENT_CLINIC_OR_DEPARTMENT_OTHER): Payer: Managed Care, Other (non HMO)

## 2013-05-04 ENCOUNTER — Encounter (HOSPITAL_BASED_OUTPATIENT_CLINIC_OR_DEPARTMENT_OTHER): Payer: Self-pay | Admitting: Emergency Medicine

## 2013-05-04 DIAGNOSIS — Z8719 Personal history of other diseases of the digestive system: Secondary | ICD-10-CM | POA: Insufficient documentation

## 2013-05-04 DIAGNOSIS — F411 Generalized anxiety disorder: Secondary | ICD-10-CM | POA: Insufficient documentation

## 2013-05-04 DIAGNOSIS — Z79899 Other long term (current) drug therapy: Secondary | ICD-10-CM | POA: Insufficient documentation

## 2013-05-04 DIAGNOSIS — R0789 Other chest pain: Secondary | ICD-10-CM | POA: Insufficient documentation

## 2013-05-04 DIAGNOSIS — R55 Syncope and collapse: Secondary | ICD-10-CM

## 2013-05-04 LAB — COMPREHENSIVE METABOLIC PANEL
ALBUMIN: 4.9 g/dL (ref 3.5–5.2)
ALK PHOS: 62 U/L (ref 39–117)
ALT: 35 U/L (ref 0–53)
AST: 22 U/L (ref 0–37)
BILIRUBIN TOTAL: 0.6 mg/dL (ref 0.3–1.2)
BUN: 13 mg/dL (ref 6–23)
CHLORIDE: 98 meq/L (ref 96–112)
CO2: 26 mEq/L (ref 19–32)
Calcium: 10.3 mg/dL (ref 8.4–10.5)
Creatinine, Ser: 1 mg/dL (ref 0.50–1.35)
GFR calc Af Amer: >90 mL/min (ref >90)
GFR calc non Af Amer: >90 mL/min (ref >90)
Glucose, Bld: 115 mg/dL — ABNORMAL HIGH (ref 70–99)
POTASSIUM: 3.8 meq/L (ref 3.7–5.3)
SODIUM: 140 meq/L (ref 137–147)
TOTAL PROTEIN: 8.2 g/dL (ref 6.0–8.3)

## 2013-05-04 LAB — CBC WITH DIFFERENTIAL/PLATELET
BASOS PCT: 0 % (ref 0–1)
Basophils Absolute: 0 10*3/uL (ref 0.0–0.1)
EOS ABS: 0.1 10*3/uL (ref 0.0–0.7)
Eosinophils Relative: 1 % (ref 0–5)
HEMATOCRIT: 46.5 % (ref 39.0–52.0)
Hemoglobin: 16.3 g/dL (ref 13.0–17.0)
LYMPHS ABS: 1.6 10*3/uL (ref 0.7–4.0)
Lymphocytes Relative: 14 % (ref 12–46)
MCH: 31.8 pg (ref 26.0–34.0)
MCHC: 35.1 g/dL (ref 30.0–36.0)
MCV: 90.8 fL (ref 78.0–100.0)
MONOS PCT: 5 % (ref 3–12)
Monocytes Absolute: 0.6 10*3/uL (ref 0.1–1.0)
NEUTROS ABS: 9 10*3/uL — AB (ref 1.7–7.7)
NEUTROS PCT: 80 % — AB (ref 43–77)
Platelets: 251 10*3/uL (ref 150–400)
RBC: 5.12 MIL/uL (ref 4.22–5.81)
RDW: 12 % (ref 11.5–15.5)
WBC: 11.3 10*3/uL — ABNORMAL HIGH (ref 4.0–10.5)

## 2013-05-04 LAB — URINALYSIS, ROUTINE W REFLEX MICROSCOPIC
BILIRUBIN URINE: NEGATIVE
GLUCOSE, UA: NEGATIVE mg/dL
HGB URINE DIPSTICK: NEGATIVE
KETONES UR: NEGATIVE mg/dL
Leukocytes, UA: NEGATIVE
Nitrite: NEGATIVE
PH: 6.5 (ref 5.0–8.0)
Protein, ur: NEGATIVE mg/dL
SPECIFIC GRAVITY, URINE: 1.009 (ref 1.005–1.030)
Urobilinogen, UA: 1 mg/dL (ref 0.0–1.0)

## 2013-05-04 LAB — T4, FREE: FREE T4: 1.5 ng/dL (ref 0.80–1.80)

## 2013-05-04 LAB — TROPONIN I: Troponin I: <0.3 ng/mL (ref <0.30)

## 2013-05-04 LAB — TSH: TSH: 1.322 u[IU]/mL (ref 0.350–4.500)

## 2013-05-04 LAB — MAGNESIUM: MAGNESIUM: 2.2 mg/dL (ref 1.5–2.5)

## 2013-05-04 LAB — CBG MONITORING, ED: Glucose-Capillary: 111 mg/dL — ABNORMAL HIGH (ref 70–99)

## 2013-05-04 NOTE — ED Notes (Signed)
Patient in restroom.

## 2013-05-04 NOTE — Discharge Instructions (Signed)
As discussed, it is important that you follow up as soon as possible with your physicians for continued management of your condition.  Please be sure to discuss the need for close followup with our cardiologists via telephone tomorrow due to today's episode of losing consciousness.  If you develop any new, or concerning changes in your condition, please return to the emergency department immediately.

## 2013-05-04 NOTE — ED Provider Notes (Signed)
CSN: [id number]     Arrival date & time 05/04/13  1432 History   First MD Initiated Contact with Patient 05/04/13 1459     Chief Complaint  Patient presents with  . Chest Pain  . Loss of Consciousness     HPI  Patient presents after an episode of chest pain, syncope.  Patient was at rest prior to the onset of a heaviness, anterior chest discomfort, then lost consciousness.  The patient was unconscious for approximately 5 minutes. Upon awakening he had no headache, confusion, disorientation, dyspnea.  He did have persistent mild chest pain. Patient also complains of sharp, stabbing pain in his right proximal anterior thigh, though with no swelling or superficial changes. Patient notes that he has won a prior smoker so, approximately one year ago, when he followed up with primary care, cardiology, with no clear etiology for the syncopal episode. Patient currently denies pain anywhere other than his anterior chest, which is mild, diffuse, anterior.  No dyspnea, no pleuritic or exertional changes in the pain.   Past Medical History  Diagnosis Date  . Small bowel obstruction 10/24/2011  . Anxiety    Past Surgical History  Procedure Laterality Date  . Colon surgery     No family history on file. History  Substance Use Topics  . Smoking status: Never Smoker   . Smokeless tobacco: Not on file  . Alcohol Use: Yes    Review of Systems  Constitutional:       Per HPI, otherwise negative  HENT:       Per HPI, otherwise negative  Respiratory:       Per HPI, otherwise negative  Cardiovascular:       Per HPI, otherwise negative  Gastrointestinal: Negative for vomiting.  Endocrine:       Negative aside from HPI  Genitourinary:       Neg aside from HPI   Musculoskeletal:       Per HPI, otherwise negative  Skin: Negative.   Neurological: Positive for syncope.      Allergies  Morphine and related  Home Medications   Current Outpatient Rx  Name  Route  Sig  Dispense  Refill   . LORazepam (ATIVAN) 0.5 MG tablet   Oral   Take 0.5 mg by mouth every 6 (six) hours as needed for anxiety.          BP 125/78  Pulse 106  Temp(Src) 98.9 F (37.2 C) (Oral)  Resp 16  Ht 5\' 5"  (1.651 m)  Wt 174 lb (78.926 kg)  BMI 28.96 kg/m2  SpO2 100% Physical Exam  Nursing note and vitals reviewed. Constitutional: He is oriented to person, place, and time. He appears well-developed. No distress.  HENT:  Head: Normocephalic and atraumatic.  Eyes: Conjunctivae and EOM are normal.  Cardiovascular: Normal rate and regular rhythm.   Pulmonary/Chest: Effort normal. No stridor. No respiratory distress.  Abdominal: He exhibits no distension.  Musculoskeletal: He exhibits no edema.  No gross deformities of the extremities, no appreciable size difference  Neurological: He is alert and oriented to person, place, and time.  Skin: Skin is warm and dry.  Psychiatric: He has a normal mood and affect.    ED Course  Procedures (including critical care time) Labs Review Labs Reviewed  COMPREHENSIVE METABOLIC PANEL - Abnormal; Notable for the following:    Glucose, Bld 115 (*)    All other components within normal limits  CBC WITH DIFFERENTIAL - Abnormal; Notable for the following:  WBC 11.3 (*)    Neutrophils Relative % 80 (*)    Neutro Abs 9.0 (*)    All other components within normal limits  CBG MONITORING, ED - Abnormal; Notable for the following:    Glucose-Capillary 111 (*)    All other components within normal limits  TROPONIN I  URINALYSIS, ROUTINE W REFLEX MICROSCOPIC  MAGNESIUM  TSH  T4, FREE  POCT CBG (FASTING - GLUCOSE)-MANUAL ENTRY   Imaging Review Dg Chest 2 View  05/04/2013   CLINICAL DATA:  Chest pain.  EXAM: CHEST  2 VIEW  COMPARISON:  08/31/2012  FINDINGS: Normal heart size and mediastinal contours. No acute infiltrate or edema. No effusion or pneumothorax. No acute osseous findings.  IMPRESSION: No active cardiopulmonary disease.   Electronically Signed    By: Tiburcio PeaJonathan  Watts M.D.   On: 05/04/2013 15:54     EKG Interpretation   Date/Time:  Thursday May 04 2013 14:40:05 EDT Ventricular Rate:  107 PR Interval:  140 QRS Duration: 74 QT Interval:  374 QTC Calculation: 499 R Axis:   71 Text Interpretation:  Sinus tachycardia Otherwise normal ECG Sinus  tachycardia Artifact Abnormal ekg Confirmed by Gerhard MunchLOCKWOOD, Zeferino Mounts  MD ([id number])  on 05/04/2013 4:49:30 PM       5:50 PM On exam the patient appears comfortable.  No chest pain.  I discussed all findings with him and his mother.  I discussed possibilities of her inpatient evaluation for syncope or outpatient close followup.  Patient has preference for outpatient followup.  Absent pain, with stable vital signs, this seems reasonable.  I discussed return precautions explicitly with him and his mother.  MDM   Final diagnoses:  Syncope    Patient presents with an episode of syncope.  Patient did have chest pain, but on my exam was awake, alert, in no distress, with no active complaints.  Patient had one prior episode in the distant past, with normal outpatient evaluation.  Patient's evaluation here today was largely reassuring, with no evidence of imminent decompensation, and absent any ongoing chest pain, discharge, per his request to pursue further evaluation and management tomorrow with his physician and cardiology. Explicit return precautions, follow up instructions discussed at length with him and his mother.  Gerhard Munchobert Trejan Buda, MD 05/04/13 [phone]

## 2013-05-04 NOTE — ED Notes (Signed)
C/o CP started while at work-then pain to right lower leg-then passed out-denies CP at this time-NAD

## 2013-05-08 ENCOUNTER — Encounter: Payer: Self-pay | Admitting: Cardiology

## 2013-05-08 ENCOUNTER — Ambulatory Visit (INDEPENDENT_AMBULATORY_CARE_PROVIDER_SITE_OTHER): Payer: Managed Care, Other (non HMO) | Admitting: Cardiology

## 2013-05-08 VITALS — BP 110/82 | HR 80 | Ht 65.0 in | Wt 164.0 lb

## 2013-05-08 DIAGNOSIS — R0789 Other chest pain: Secondary | ICD-10-CM

## 2013-05-08 DIAGNOSIS — R079 Chest pain, unspecified: Secondary | ICD-10-CM

## 2013-05-08 DIAGNOSIS — R55 Syncope and collapse: Secondary | ICD-10-CM
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
## 2013-05-08 NOTE — Patient Instructions (Signed)
The current medical regimen is effective;  continue present plan and medications.  Follow up as needed 

## 2013-05-09 ENCOUNTER — Encounter (HOSPITAL_COMMUNITY): Payer: Self-pay | Admitting: Emergency Medicine

## 2013-05-09 ENCOUNTER — Emergency Department (HOSPITAL_COMMUNITY)
Admission: EM | Admit: 2013-05-09 | Discharge: 2013-05-09 | Disposition: A | Discharge status: Home / Self Care | Payer: Managed Care, Other (non HMO) | Attending: Emergency Medicine | Admitting: Emergency Medicine

## 2013-05-09 DIAGNOSIS — R000 Tachycardia, unspecified: Secondary | ICD-10-CM | POA: Insufficient documentation

## 2013-05-09 DIAGNOSIS — Q602 Renal agenesis, unspecified: Secondary | ICD-10-CM | POA: Insufficient documentation

## 2013-05-09 DIAGNOSIS — Z8719 Personal history of other diseases of the digestive system: Secondary | ICD-10-CM | POA: Insufficient documentation

## 2013-05-09 DIAGNOSIS — Z79899 Other long term (current) drug therapy: Secondary | ICD-10-CM | POA: Insufficient documentation

## 2013-05-09 DIAGNOSIS — T43205A Adverse effect of unspecified antidepressants, initial encounter: Secondary | ICD-10-CM | POA: Insufficient documentation

## 2013-05-09 DIAGNOSIS — F411 Generalized anxiety disorder: Secondary | ICD-10-CM | POA: Insufficient documentation

## 2013-05-09 DIAGNOSIS — Q605 Renal hypoplasia, unspecified: Secondary | ICD-10-CM

## 2013-05-09 DIAGNOSIS — T50905A Adverse effect of unspecified drugs, medicaments and biological substances, initial encounter: Secondary | ICD-10-CM

## 2013-05-09 DIAGNOSIS — R42 Dizziness and giddiness: Secondary | ICD-10-CM | POA: Insufficient documentation

## 2013-05-09 DIAGNOSIS — R002 Palpitations: Secondary | ICD-10-CM | POA: Insufficient documentation

## 2013-05-09 DIAGNOSIS — M79609 Pain in unspecified limb: Secondary | ICD-10-CM | POA: Insufficient documentation

## 2013-05-09 LAB — CBC WITH DIFFERENTIAL/PLATELET
BASOS PCT: 0 % (ref 0–1)
Basophils Absolute: 0 10*3/uL (ref 0.0–0.1)
Eosinophils Absolute: 0.1 10*3/uL (ref 0.0–0.7)
Eosinophils Relative: 1 % (ref 0–5)
HCT: 42.8 % (ref 39.0–52.0)
Hemoglobin: 15.6 g/dL (ref 13.0–17.0)
LYMPHS PCT: 14 % (ref 12–46)
Lymphs Abs: 1 10*3/uL (ref 0.7–4.0)
MCH: 32.4 pg (ref 26.0–34.0)
MCHC: 36.4 g/dL — AB (ref 30.0–36.0)
MCV: 88.8 fL (ref 78.0–100.0)
Monocytes Absolute: 0.3 10*3/uL (ref 0.1–1.0)
Monocytes Relative: 4 % (ref 3–12)
NEUTROS PCT: 80 % — AB (ref 43–77)
Neutro Abs: 5.5 10*3/uL (ref 1.7–7.7)
Platelets: 194 10*3/uL (ref 150–400)
RBC: 4.82 MIL/uL (ref 4.22–5.81)
RDW: 11.8 % (ref 11.5–15.5)
WBC: 6.8 10*3/uL (ref 4.0–10.5)

## 2013-05-09 LAB — COMPREHENSIVE METABOLIC PANEL
ALBUMIN: 4.2 g/dL (ref 3.5–5.2)
ALK PHOS: 55 U/L (ref 39–117)
ALT: 18 U/L (ref 0–53)
AST: 16 U/L (ref 0–37)
BILIRUBIN TOTAL: 0.8 mg/dL (ref 0.3–1.2)
BUN: 18 mg/dL (ref 6–23)
CO2: 25 meq/L (ref 19–32)
Calcium: 9.5 mg/dL (ref 8.4–10.5)
Chloride: 97 mEq/L (ref 96–112)
Creatinine, Ser: 0.98 mg/dL (ref 0.50–1.35)
GFR calc Af Amer: >90 mL/min (ref >90)
GFR calc non Af Amer: >90 mL/min (ref >90)
Glucose, Bld: 177 mg/dL — ABNORMAL HIGH (ref 70–99)
Potassium: 3.5 mEq/L — ABNORMAL LOW (ref 3.7–5.3)
SODIUM: 137 meq/L (ref 137–147)
Total Protein: 7.1 g/dL (ref 6.0–8.3)

## 2013-05-09 LAB — I-STAT TROPONIN, ED: TROPONIN I, POC: 0 ng/mL (ref 0.00–0.08)

## 2013-05-09 NOTE — Discharge Instructions (Signed)
Take your Zoloft in evening, or at night to avoid symptoms during the day.  Stay well hydrated.  If her symptoms persist, call Dr. Antoine PocheHochrein for reevaluation.

## 2013-05-09 NOTE — ED Notes (Signed)
MD at bedside. 
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
## 2013-05-09 NOTE — ED Notes (Signed)
Pt reports being seen at [facility] on Thursday for chest pain. Has been seen by pcp and cardiology since. Went to work today and had onset again of felt like heart was racing, sob and mid chest tightness. ekg done on arrival, airway intact, no acute distress noted.

## 2013-05-10 DIAGNOSIS — R079 Chest pain, unspecified: Secondary | ICD-10-CM | POA: Insufficient documentation

## 2014-06-04 ENCOUNTER — Other Ambulatory Visit: Payer: Self-pay

## 2014-06-04 NOTE — Telephone Encounter (Signed)
error 

## 2014-08-17 ENCOUNTER — Emergency Department (HOSPITAL_BASED_OUTPATIENT_CLINIC_OR_DEPARTMENT_OTHER)
Admission: EM | Admit: 2014-08-17 | Discharge: 2014-08-17 | Disposition: A | Discharge status: Home / Self Care | Payer: Managed Care, Other (non HMO) | Attending: Emergency Medicine | Admitting: Emergency Medicine

## 2014-08-17 ENCOUNTER — Emergency Department (HOSPITAL_BASED_OUTPATIENT_CLINIC_OR_DEPARTMENT_OTHER): Payer: Managed Care, Other (non HMO)

## 2014-08-17 ENCOUNTER — Encounter (HOSPITAL_BASED_OUTPATIENT_CLINIC_OR_DEPARTMENT_OTHER): Payer: Self-pay | Admitting: *Deleted

## 2014-08-17 DIAGNOSIS — Z9049 Acquired absence of other specified parts of digestive tract: Secondary | ICD-10-CM | POA: Insufficient documentation

## 2014-08-17 DIAGNOSIS — Z79899 Other long term (current) drug therapy: Secondary | ICD-10-CM | POA: Insufficient documentation

## 2014-08-17 DIAGNOSIS — R109 Unspecified abdominal pain: Secondary | ICD-10-CM | POA: Insufficient documentation

## 2014-08-17 DIAGNOSIS — R197 Diarrhea, unspecified: Secondary | ICD-10-CM | POA: Diagnosis not present

## 2014-08-17 DIAGNOSIS — R42 Dizziness and giddiness: Secondary | ICD-10-CM | POA: Diagnosis not present

## 2014-08-17 DIAGNOSIS — Z8719 Personal history of other diseases of the digestive system: Secondary | ICD-10-CM | POA: Diagnosis not present

## 2014-08-17 DIAGNOSIS — Q602 Renal agenesis, unspecified: Secondary | ICD-10-CM | POA: Diagnosis not present

## 2014-08-17 LAB — CBC WITH DIFFERENTIAL/PLATELET
Basophils Absolute: 0 10*3/uL (ref 0.0–0.1)
Basophils Relative: 0 % (ref 0–1)
EOS ABS: 0.2 10*3/uL (ref 0.0–0.7)
Eosinophils Relative: 2 % (ref 0–5)
HCT: 43.6 % (ref 39.0–52.0)
Hemoglobin: 15 g/dL (ref 13.0–17.0)
LYMPHS ABS: 1.7 10*3/uL (ref 0.7–4.0)
Lymphocytes Relative: 18 % (ref 12–46)
MCH: 31.3 pg (ref 26.0–34.0)
MCHC: 34.4 g/dL (ref 30.0–36.0)
MCV: 90.8 fL (ref 78.0–100.0)
Monocytes Absolute: 0.5 10*3/uL (ref 0.1–1.0)
Monocytes Relative: 5 % (ref 3–12)
NEUTROS ABS: 6.8 10*3/uL (ref 1.7–7.7)
Neutrophils Relative %: 75 % (ref 43–77)
PLATELETS: 223 10*3/uL (ref 150–400)
RBC: 4.8 MIL/uL (ref 4.22–5.81)
RDW: 12.1 % (ref 11.5–15.5)
WBC: 9.1 10*3/uL (ref 4.0–10.5)

## 2014-08-17 LAB — URINALYSIS, ROUTINE W REFLEX MICROSCOPIC
Bilirubin Urine: NEGATIVE
Glucose, UA: NEGATIVE mg/dL
HGB URINE DIPSTICK: NEGATIVE
Ketones, ur: NEGATIVE mg/dL
Leukocytes, UA: NEGATIVE
Nitrite: NEGATIVE
PROTEIN: NEGATIVE mg/dL
Specific Gravity, Urine: 1.022 (ref 1.005–1.030)
Urobilinogen, UA: 0.2 mg/dL (ref 0.0–1.0)
pH: 6 (ref 5.0–8.0)

## 2014-08-17 LAB — COMPREHENSIVE METABOLIC PANEL
ALBUMIN: 4.5 g/dL (ref 3.5–5.0)
ALT: 19 U/L (ref 17–63)
AST: 19 U/L (ref 15–41)
Alkaline Phosphatase: 53 U/L (ref 38–126)
Anion gap: 7 (ref 5–15)
BUN: 15 mg/dL (ref 6–20)
CHLORIDE: 106 mmol/L (ref 101–111)
CO2: 26 mmol/L (ref 22–32)
CREATININE: 0.97 mg/dL (ref 0.61–1.24)
Calcium: 9.2 mg/dL (ref 8.9–10.3)
Glucose, Bld: 104 mg/dL — ABNORMAL HIGH (ref 65–99)
POTASSIUM: 3.9 mmol/L (ref 3.5–5.1)
Sodium: 139 mmol/L (ref 135–145)
TOTAL PROTEIN: 7.4 g/dL (ref 6.5–8.1)
Total Bilirubin: 0.7 mg/dL (ref 0.3–1.2)

## 2014-08-17 MED ORDER — SODIUM CHLORIDE 0.9 % IV BOLUS (SEPSIS)
1000.0000 mL | Freq: Once | INTRAVENOUS | Status: AC
  Administered 2014-08-17: 1000 mL via INTRAVENOUS
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
## 2014-08-17 NOTE — Discharge Instructions (Signed)

## 2014-08-17 NOTE — ED Notes (Signed)
Watery diarrhea x 3 days. Dizziness.

## 2015-02-22 ENCOUNTER — Encounter (HOSPITAL_BASED_OUTPATIENT_CLINIC_OR_DEPARTMENT_OTHER): Payer: Self-pay

## 2015-02-22 ENCOUNTER — Emergency Department (HOSPITAL_BASED_OUTPATIENT_CLINIC_OR_DEPARTMENT_OTHER): Payer: Managed Care, Other (non HMO)

## 2015-02-22 ENCOUNTER — Emergency Department (HOSPITAL_BASED_OUTPATIENT_CLINIC_OR_DEPARTMENT_OTHER)
Admission: EM | Admit: 2015-02-22 | Discharge: 2015-02-22 | Disposition: A | Discharge status: Home / Self Care | Payer: Managed Care, Other (non HMO) | Attending: Emergency Medicine | Admitting: Emergency Medicine

## 2015-02-22 DIAGNOSIS — Y998 Other external cause status: Secondary | ICD-10-CM | POA: Insufficient documentation

## 2015-02-22 DIAGNOSIS — Y9389 Activity, other specified: Secondary | ICD-10-CM | POA: Insufficient documentation

## 2015-02-22 DIAGNOSIS — Z8719 Personal history of other diseases of the digestive system: Secondary | ICD-10-CM | POA: Insufficient documentation

## 2015-02-22 DIAGNOSIS — S0990XA Unspecified injury of head, initial encounter: Secondary | ICD-10-CM

## 2015-02-22 DIAGNOSIS — Q602 Renal agenesis, unspecified: Secondary | ICD-10-CM | POA: Insufficient documentation

## 2015-02-22 DIAGNOSIS — Y9289 Other specified places as the place of occurrence of the external cause: Secondary | ICD-10-CM | POA: Diagnosis not present

## 2015-02-22 DIAGNOSIS — Z8659 Personal history of other mental and behavioral disorders: Secondary | ICD-10-CM | POA: Diagnosis not present

## 2015-02-22 NOTE — Discharge Instructions (Signed)
Head Injury, Adult °You have a head injury. Headaches and throwing up (vomiting) are common after a head injury. It should be easy to wake up from sleeping. Sometimes you must stay in the hospital. Most problems happen within the first 24 hours. Side effects may occur up to 7-10 days after the injury.  °WHAT ARE THE TYPES OF HEAD INJURIES? °Head injuries can be as minor as a bump. Some head injuries can be more severe. More severe head injuries include: °· A jarring injury to the brain (concussion). °· A bruise of the brain (contusion). This mean there is bleeding in the brain that can cause swelling. °· A cracked skull (skull fracture). °· Bleeding in the brain that collects, clots, and forms a bump (hematoma). °WHEN SHOULD I GET HELP RIGHT AWAY?  °· You are confused or sleepy. °· You cannot be woken up. °· You feel sick to your stomach (nauseous) or keep throwing up (vomiting). °· Your dizziness or unsteadiness is getting worse. °· You have very bad, lasting headaches that are not helped by medicine. Take medicines only as told by your doctor. °· You cannot use your arms or legs like normal. °· You cannot walk. °· You notice changes in the black spots in the center of the colored part of your eye (pupil). °· You have clear or bloody fluid coming from your nose or ears. °· You have trouble seeing. °During the next 24 hours after the injury, you must stay with someone who can watch you. This person should get help right away (call 911 in the [place] if you start to shake and are not able to control it (have seizures), you pass out, or you are unable to wake up. °HOW CAN I PREVENT A HEAD INJURY IN THE FUTURE? °· Wear seat belts. °· Wear a helmet while bike riding and playing sports like football. °· Stay away from dangerous activities around the house. °WHEN CAN I RETURN TO NORMAL ACTIVITIES AND ATHLETICS? °See your doctor before doing these activities. You should not do normal activities or play contact sports until 1  week after the following symptoms have stopped: °· Headache that does not go away. °· Dizziness. °· Poor attention. °· Confusion. °· Memory problems. °· Sickness to your stomach or throwing up. °· Tiredness. °· Fussiness. °· Bothered by bright lights or loud noises. °· Anxiousness or depression. °· Restless sleep. °MAKE SURE YOU:  °· Understand these instructions. °· Will watch your condition. °· Will get help right away if you are not doing well or get worse. °  °This information is not intended to replace advice given to you by your health care provider. Make sure you discuss any questions you have with your health care provider. °  °Document Released: 01/16/2008 Document Revised: 02/23/2014 Document Reviewed: 10/10/2012 °Elsevier Interactive Patient Education ©2016 [employer] ° °
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
## 2020-10-06 ENCOUNTER — Other Ambulatory Visit: Payer: Self-pay

## 2020-10-06 ENCOUNTER — Encounter (HOSPITAL_BASED_OUTPATIENT_CLINIC_OR_DEPARTMENT_OTHER): Payer: Self-pay | Admitting: *Deleted

## 2020-10-06 ENCOUNTER — Inpatient Hospital Stay (HOSPITAL_BASED_OUTPATIENT_CLINIC_OR_DEPARTMENT_OTHER)
Admission: EM | Admit: 2020-10-06 | Discharge: 2020-10-09 | DRG: 389 | Disposition: A | Discharge status: Home / Self Care | Payer: 59 | Attending: Surgery | Admitting: Surgery

## 2020-10-06 DIAGNOSIS — K56609 Unspecified intestinal obstruction, unspecified as to partial versus complete obstruction: Secondary | ICD-10-CM | POA: Diagnosis present

## 2020-10-06 DIAGNOSIS — Q600 Renal agenesis, unilateral: Secondary | ICD-10-CM | POA: Diagnosis not present

## 2020-10-06 DIAGNOSIS — I10 Essential (primary) hypertension: Secondary | ICD-10-CM | POA: Diagnosis present

## 2020-10-06 DIAGNOSIS — Z9049 Acquired absence of other specified parts of digestive tract: Secondary | ICD-10-CM | POA: Diagnosis not present

## 2020-10-06 DIAGNOSIS — Z885 Allergy status to narcotic agent status: Secondary | ICD-10-CM

## 2020-10-06 DIAGNOSIS — Z978 Presence of other specified devices: Secondary | ICD-10-CM

## 2020-10-06 DIAGNOSIS — Z8719 Personal history of other diseases of the digestive system: Secondary | ICD-10-CM

## 2020-10-06 DIAGNOSIS — F419 Anxiety disorder, unspecified: Secondary | ICD-10-CM | POA: Diagnosis present

## 2020-10-06 DIAGNOSIS — Z20822 Contact with and (suspected) exposure to covid-19: Secondary | ICD-10-CM | POA: Diagnosis not present

## 2020-10-06 DIAGNOSIS — Z0189 Encounter for other specified special examinations: Secondary | ICD-10-CM

## 2020-10-06 DIAGNOSIS — K5669 Other partial intestinal obstruction: Secondary | ICD-10-CM | POA: Diagnosis not present

## 2020-10-06 LAB — CBC WITH DIFFERENTIAL/PLATELET
Abs Immature Granulocytes: 0.05 10*3/uL (ref 0.00–0.07)
Basophils Absolute: 0.1 10*3/uL (ref 0.0–0.1)
Basophils Relative: 1 %
Eosinophils Absolute: 0.1 10*3/uL (ref 0.0–0.5)
Eosinophils Relative: 1 %
HCT: 48.5 % (ref 39.0–52.0)
Hemoglobin: 17.4 g/dL — ABNORMAL HIGH (ref 13.0–17.0)
Immature Granulocytes: 0 %
Lymphocytes Relative: 11 %
Lymphs Abs: 1.6 10*3/uL (ref 0.7–4.0)
MCH: 31.7 pg (ref 26.0–34.0)
MCHC: 35.9 g/dL (ref 30.0–36.0)
MCV: 88.3 fL (ref 80.0–100.0)
Monocytes Absolute: 1 10*3/uL (ref 0.1–1.0)
Monocytes Relative: 7 %
Neutro Abs: 11.6 10*3/uL — ABNORMAL HIGH (ref 1.7–7.7)
Neutrophils Relative %: 80 %
Platelets: 286 10*3/uL (ref 150–400)
RBC: 5.49 MIL/uL (ref 4.22–5.81)
RDW: 11.8 % (ref 11.5–15.5)
WBC: 14.5 10*3/uL — ABNORMAL HIGH (ref 4.0–10.5)
nRBC: 0 % (ref 0.0–0.2)

## 2020-10-06 MED ORDER — FENTANYL CITRATE (PF) 100 MCG/2ML IJ SOLN
50.0000 ug | Freq: Once | INTRAMUSCULAR | Status: AC
  Administered 2020-10-07: 50 ug via INTRAVENOUS
  Filled 2020-10-06: qty 2
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
## 2020-10-07 ENCOUNTER — Inpatient Hospital Stay (HOSPITAL_COMMUNITY): Payer: 59

## 2020-10-07 ENCOUNTER — Emergency Department (HOSPITAL_BASED_OUTPATIENT_CLINIC_OR_DEPARTMENT_OTHER): Payer: 59

## 2020-10-07 ENCOUNTER — Encounter (HOSPITAL_BASED_OUTPATIENT_CLINIC_OR_DEPARTMENT_OTHER): Payer: Self-pay | Admitting: Emergency Medicine

## 2020-10-07 DIAGNOSIS — K56609 Unspecified intestinal obstruction, unspecified as to partial versus complete obstruction: Secondary | ICD-10-CM | POA: Diagnosis present

## 2020-10-07 DIAGNOSIS — Z20822 Contact with and (suspected) exposure to covid-19: Secondary | ICD-10-CM | POA: Diagnosis present

## 2020-10-07 DIAGNOSIS — K5669 Other partial intestinal obstruction: Secondary | ICD-10-CM | POA: Diagnosis present

## 2020-10-07 DIAGNOSIS — Z9049 Acquired absence of other specified parts of digestive tract: Secondary | ICD-10-CM | POA: Diagnosis not present

## 2020-10-07 DIAGNOSIS — F419 Anxiety disorder, unspecified: Secondary | ICD-10-CM | POA: Diagnosis present

## 2020-10-07 DIAGNOSIS — I10 Essential (primary) hypertension: Secondary | ICD-10-CM | POA: Diagnosis present

## 2020-10-07 DIAGNOSIS — Z885 Allergy status to narcotic agent status: Secondary | ICD-10-CM | POA: Diagnosis not present

## 2020-10-07 DIAGNOSIS — Q600 Renal agenesis, unilateral: Secondary | ICD-10-CM | POA: Diagnosis not present

## 2020-10-07 LAB — CREATININE, SERUM
Creatinine, Ser: 1.06 mg/dL (ref 0.61–1.24)
GFR, Estimated: >60 mL/min (ref >60)

## 2020-10-07 LAB — COMPREHENSIVE METABOLIC PANEL
ALT: 34 U/L (ref 0–44)
AST: 35 U/L (ref 15–41)
Albumin: 5 g/dL (ref 3.5–5.0)
Alkaline Phosphatase: 72 U/L (ref 38–126)
Anion gap: 14 (ref 5–15)
BUN: 17 mg/dL (ref 6–20)
CO2: 24 mmol/L (ref 22–32)
Calcium: 9.6 mg/dL (ref 8.9–10.3)
Chloride: 99 mmol/L (ref 98–111)
Creatinine, Ser: 1.36 mg/dL — ABNORMAL HIGH (ref 0.61–1.24)
GFR, Estimated: >60 mL/min (ref >60)
Glucose, Bld: 115 mg/dL — ABNORMAL HIGH (ref 70–99)
Potassium: 4.5 mmol/L (ref 3.5–5.1)
Sodium: 137 mmol/L (ref 135–145)
Total Bilirubin: 1.7 mg/dL — ABNORMAL HIGH (ref 0.3–1.2)
Total Protein: 8.3 g/dL — ABNORMAL HIGH (ref 6.5–8.1)

## 2020-10-07 LAB — CBC
HCT: 45.9 % (ref 39.0–52.0)
Hemoglobin: 15.7 g/dL (ref 13.0–17.0)
MCH: 31.5 pg (ref 26.0–34.0)
MCHC: 34.2 g/dL (ref 30.0–36.0)
MCV: 92.2 fL (ref 80.0–100.0)
Platelets: 219 10*3/uL (ref 150–400)
RBC: 4.98 MIL/uL (ref 4.22–5.81)
RDW: 12.1 % (ref 11.5–15.5)
WBC: 9.5 10*3/uL (ref 4.0–10.5)
nRBC: 0 % (ref 0.0–0.2)

## 2020-10-07 LAB — RESP PANEL BY RT-PCR (FLU A&B, COVID) ARPGX2
Influenza A by PCR: NEGATIVE
Influenza B by PCR: NEGATIVE
SARS Coronavirus 2 by RT PCR: NEGATIVE

## 2020-10-07 LAB — HIV ANTIBODY (ROUTINE TESTING W REFLEX): HIV Screen 4th Generation wRfx: NONREACTIVE

## 2020-10-07 MED ORDER — DIPHENHYDRAMINE HCL 25 MG PO CAPS
25.0000 mg | ORAL_CAPSULE | Freq: Four times a day (QID) | ORAL | Status: DC | PRN
Start: 2020-10-07 — End: 2020-10-09

## 2020-10-07 MED ORDER — KCL IN DEXTROSE-NACL 20-5-0.9 MEQ/L-%-% IV SOLN
INTRAVENOUS | Status: DC
Start: 2020-10-07 — End: 2020-10-09
  Filled 2020-10-07 (×4): qty 1000

## 2020-10-07 MED ORDER — ONDANSETRON HCL 4 MG/2ML IJ SOLN
4.0000 mg | Freq: Four times a day (QID) | INTRAMUSCULAR | Status: DC | PRN
Start: 2020-10-07 — End: 2020-10-09
  Administered 2020-10-07 – 2020-10-08 (×2): 4 mg via INTRAVENOUS
  Filled 2020-10-07 (×2): qty 2

## 2020-10-07 MED ORDER — DIPHENHYDRAMINE HCL 50 MG/ML IJ SOLN
25.0000 mg | Freq: Four times a day (QID) | INTRAMUSCULAR | Status: DC | PRN
Start: 2020-10-07 — End: 2020-10-09

## 2020-10-07 MED ORDER — SODIUM CHLORIDE 0.9 % IV BOLUS
1000.0000 mL | Freq: Once | INTRAVENOUS | Status: AC
  Administered 2020-10-07: 1000 mL via INTRAVENOUS

## 2020-10-07 MED ORDER — ONDANSETRON 4 MG PO TBDP: 4.0000 mg | ORAL_TABLET | Freq: Four times a day (QID) | ORAL | Status: DC | PRN

## 2020-10-07 MED ORDER — ENOXAPARIN SODIUM 40 MG/0.4ML IJ SOSY
40.0000 mg | PREFILLED_SYRINGE | Freq: Every day | INTRAMUSCULAR | Status: DC
Start: 2020-10-07 — End: 2020-10-09
  Administered 2020-10-07 – 2020-10-09 (×3): 40 mg via SUBCUTANEOUS
  Filled 2020-10-07 (×3): qty 0.4

## 2020-10-07 MED ORDER — HYDROMORPHONE HCL 1 MG/ML IJ SOLN
0.5000 mg | INTRAMUSCULAR | Status: DC | PRN
  Administered 2020-10-07 (×3): 1 mg via INTRAVENOUS
  Administered 2020-10-08: 0.5 mg via INTRAVENOUS
  Filled 2020-10-07 (×4): qty 1

## 2020-10-07 MED ORDER — DIATRIZOATE MEGLUMINE & SODIUM 66-10 % PO SOLN
90.0000 mL | Freq: Once | ORAL | Status: AC
Start: 2020-10-07 — End: 2020-10-07
  Administered 2020-10-07: 90 mL via NASOGASTRIC
  Filled 2020-10-07: qty 90

## 2020-10-07 MED ORDER — IOHEXOL 300 MG/ML  SOLN
75.0000 mL | Freq: Once | INTRAMUSCULAR | Status: AC | PRN
  Administered 2020-10-07: 75 mL via INTRAVENOUS
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
## 2020-10-07 NOTE — ED Notes (Signed)
Pt tx from [facility]. Report from [facility]. NG tube in place upon arrival, low int.suction. Pt states pain 4/10 at this time. Offers no other complaints.

## 2020-10-07 NOTE — ED Provider Notes (Signed)
Patient here for evaluation of nausea and vomiting. He was transferred for general surgery evaluation for small bowel obstruction. NG tube was placed prior to transfer. Patient states that he is feeling improved with placement of the NG tube. General surgery consulted for further management.   Tilden Fossa, MD 10/07/20 [phone]
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
## 2020-10-07 NOTE — Plan of Care (Signed)
  Problem: Health Behavior/Discharge Planning: Goal: Ability to manage health-related needs will improve Outcome: Progressing   

## 2020-10-08 LAB — MAGNESIUM: Magnesium: 2.3 mg/dL (ref 1.7–2.4)

## 2020-10-08 LAB — BASIC METABOLIC PANEL
Anion gap: 5 (ref 5–15)
BUN: 15 mg/dL (ref 6–20)
CO2: 25 mmol/L (ref 22–32)
Calcium: 8.5 mg/dL — ABNORMAL LOW (ref 8.9–10.3)
Chloride: 107 mmol/L (ref 98–111)
Creatinine, Ser: 1.09 mg/dL (ref 0.61–1.24)
GFR, Estimated: >60 mL/min (ref >60)
Glucose, Bld: 113 mg/dL — ABNORMAL HIGH (ref 70–99)
Potassium: 4 mmol/L (ref 3.5–5.1)
Sodium: 137 mmol/L (ref 135–145)
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
## 2020-10-08 NOTE — Plan of Care (Signed)
  Problem: Clinical Measurements: Goal: Ability to maintain clinical measurements within normal limits will improve Outcome: Progressing   

## 2020-10-09 DIAGNOSIS — Z8719 Personal history of other diseases of the digestive system: Secondary | ICD-10-CM
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
## 2020-10-09 NOTE — Progress Notes (Signed)
   10/09/20 1300  Mobility  Activity Refused mobility    Pt refused mobility secondary to recently walking with nurses and being discharged.  [facility] Mobility Specialist Acute Rehab Services Office: [phone]

## 2020-10-09 NOTE — Discharge Instructions (Signed)
Follow a soft diet for a few days until abdominal soreness is completely resolved
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
## 2021-05-26 ENCOUNTER — Ambulatory Visit: Payer: BC Managed Care – PPO | Admitting: Family Medicine

## 2021-05-26 ENCOUNTER — Encounter: Payer: Self-pay | Admitting: Family Medicine

## 2021-05-26 VITALS — BP 110/70 | HR 72 | Temp 98.2°F | Ht 65.0 in | Wt 200.1 lb

## 2021-05-26 DIAGNOSIS — Z23 Encounter for immunization: Secondary | ICD-10-CM | POA: Diagnosis not present

## 2021-05-26 DIAGNOSIS — Z0000 Encounter for general adult medical examination without abnormal findings: Secondary | ICD-10-CM

## 2021-05-26 DIAGNOSIS — R0681 Apnea, not elsewhere classified: Secondary | ICD-10-CM

## 2021-05-26 DIAGNOSIS — Z1159 Encounter for screening for other viral diseases: Secondary | ICD-10-CM | POA: Diagnosis not present

## 2021-05-26 LAB — COMPREHENSIVE METABOLIC PANEL
ALT: 37 U/L (ref 0–53)
AST: 20 U/L (ref 0–37)
Albumin: 4.8 g/dL (ref 3.5–5.2)
Alkaline Phosphatase: 58 U/L (ref 39–117)
BUN: 14 mg/dL (ref 6–23)
CO2: 27 mEq/L (ref 19–32)
Calcium: 9.6 mg/dL (ref 8.4–10.5)
Chloride: 102 mEq/L (ref 96–112)
Creatinine, Ser: 1.02 mg/dL (ref 0.40–1.50)
GFR: 95.63 mL/min (ref >60.00)
Glucose, Bld: 85 mg/dL (ref 70–99)
Potassium: 4 mEq/L (ref 3.5–5.1)
Sodium: 137 mEq/L (ref 135–145)
Total Bilirubin: 0.6 mg/dL (ref 0.2–1.2)
Total Protein: 7 g/dL (ref 6.0–8.3)

## 2021-05-26 LAB — CBC
HCT: 42.2 % (ref 39.0–52.0)
Hemoglobin: 14.8 g/dL (ref 13.0–17.0)
MCHC: 35.1 g/dL (ref 30.0–36.0)
MCV: 90.5 fl (ref 78.0–100.0)
Platelets: 223 10*3/uL (ref 150.0–400.0)
RBC: 4.66 Mil/uL (ref 4.22–5.81)
RDW: 12.4 % (ref 11.5–15.5)
WBC: 6.4 10*3/uL (ref 4.0–10.5)

## 2021-05-26 LAB — HEMOGLOBIN A1C: Hgb A1c MFr Bld: 5.6 % (ref 4.6–6.5)

## 2021-05-26 LAB — LIPID PANEL
Cholesterol: 177 mg/dL (ref 0–200)
HDL: 40.9 mg/dL (ref >39.00)
NonHDL: 136.53
Total CHOL/HDL Ratio: 4
Triglycerides: 226 mg/dL — ABNORMAL HIGH (ref 0.0–149.0)
VLDL: 45.2 mg/dL — ABNORMAL HIGH (ref 0.0–40.0)

## 2021-05-26 LAB — LDL CHOLESTEROL, DIRECT: Direct LDL: 110 mg/dL

## 2021-05-26 NOTE — Addendum Note (Signed)
Addended by: Sharon Seller B on: 05/26/2021 01:28 PM ? ? Modules accepted: Orders ? ?

## 2021-05-26 NOTE — Patient Instructions (Addendum)
Give [place] 2-3 business days to get the results of your labs back.  ? ?Keep the diet clean and stay active. ? ?Do monthly self testicular checks in the shower. You are feeling for lumps/bumps that don't belong. If you feel anything like this, let me know! ? ?Please get me a copy of your advanced directive form at your convenience.  ? ?Follow up with your eye doctor please.  ? ?Let us know if you need anything. ? ? ?
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
## 2021-05-27 ENCOUNTER — Other Ambulatory Visit: Payer: Self-pay | Admitting: Family Medicine

## 2021-05-27 DIAGNOSIS — E785 Hyperlipidemia, unspecified: Secondary | ICD-10-CM

## 2021-05-27 LAB — HEPATITIS C ANTIBODY
Hepatitis C Ab: NONREACTIVE
SIGNAL TO CUT-OFF: 0.11 (ref <1.00)

## 2021-06-19 ENCOUNTER — Ambulatory Visit (INDEPENDENT_AMBULATORY_CARE_PROVIDER_SITE_OTHER): Payer: BC Managed Care – PPO | Admitting: Pulmonary Disease

## 2021-06-19 ENCOUNTER — Encounter: Payer: Self-pay | Admitting: Pulmonary Disease

## 2021-06-19 VITALS — BP 118/62 | HR 73 | Ht 65.0 in | Wt 197.2 lb

## 2021-06-19 DIAGNOSIS — R0683 Snoring: Secondary | ICD-10-CM | POA: Diagnosis not present
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
## 2021-06-19 NOTE — Patient Instructions (Signed)
Moderate probability of significant obstructive sleep apnea ? ?We will schedule you for home sleep study ? ?We will update you with results as soon as the study is reviewed ? ?Tentative follow-up in 3 to 4 months ? ?Encourage weight loss efforts ? ?Call us with significant concerns ? ?Living With Sleep Apnea ?Sleep apnea is a condition in which breathing pauses or becomes shallow during sleep. Sleep apnea is most commonly caused by a collapsed or blocked airway. People with sleep apnea usually snore loudly. They may have times when they gasp and stop breathing for 10 seconds or more during sleep. This may happen many times during the night. ?The breaks in breathing also interrupt the deep sleep that you need to feel rested. Even if you do not completely wake up from the gaps in breathing, your sleep may not be restful and you feel tired during the day. You may also have a headache in the morning and low energy during the day, and you may feel anxious or depressed. ?How can sleep apnea affect me? ?Sleep apnea increases your chances of extreme tiredness during the day (daytime fatigue). It can also increase your risk for health conditions, such as: ?Heart attack. ?Stroke. ?Obesity. ?Type 2 diabetes. ?Heart failure. ?Irregular heartbeat. ?High blood pressure. ?If you have daytime fatigue as a result of sleep apnea, you may be more likely to: ?Perform poorly at school or work. ?Fall asleep while driving. ?Have difficulty with attention. ?Develop depression or anxiety. ?Have sexual dysfunction. ?What actions can I take to manage sleep apnea? ?Sleep apnea treatment ? ?If you were given a device to open your airway while you sleep, use it only as told by your health care provider. You may be given: ?An oral appliance. This is a custom-made mouthpiece that shifts your lower jaw forward. ?A continuous positive airway pressure (CPAP) device. This device blows air through a mask when you breathe out (exhale). ?A nasal  expiratory positive airway pressure (EPAP) device. This device has valves that you put into each nostril. ?A bi-level positive airway pressure (BIPAP) device. This device blows air through a mask when you breathe in (inhale) and breathe out (exhale). ?You may need surgery if other treatments do not work for you. ?Sleep habits ?Go to sleep and wake up at the same time every day. This helps set your internal clock (circadian rhythm) for sleeping. ?If you stay up later than usual, such as on weekends, try to get up in the morning within 2 hours of your normal wake time. ?Try to get at least 7-9 hours of sleep each night. ?Stop using a computer, tablet, and mobile phone a few hours before bedtime. ?Do not take long naps during the day. If you nap, limit it to 30 minutes. ?Have a relaxing bedtime routine. Reading or listening to music may relax you and help you sleep. ?Use your bedroom only for sleep. ?Keep your television and computer out of your bedroom. ?Keep your bedroom cool, dark, and quiet. ?Use a supportive mattress and pillows. ?Follow your health care provider's instructions for other changes to sleep habits. ?Nutrition ?Do not eat heavy meals in the evening. ?Do not have caffeine in the later part of the day. The effects of caffeine can last for more than 5 hours. ?Follow your health care provider's or dietitian's instructions for any diet changes. ?Lifestyle ? ?  ? ?Do not drink alcohol before bedtime. Alcohol can cause you to fall asleep at first, but then it can cause you  to wake up in the middle of the night and have trouble getting back to sleep. ?Do not use any products that contain nicotine or tobacco. These products include cigarettes, chewing tobacco, and vaping devices, such as e-cigarettes. If you need help quitting, ask your health care provider. ?Medicines ?Take over-the-counter and prescription medicines only as told by your health care provider. ?Do not use over-the-counter sleep medicine. You  can become dependent on this medicine, and it can make sleep apnea worse. ?Do not use medicines, such as sedatives and narcotics, unless told by your health care provider. ?Activity ?Exercise on most days, but avoid exercising in the evening. Exercising near bedtime can interfere with sleeping. ?If possible, spend time outside every day. Natural light helps regulate your circadian rhythm. ?General information ?Lose weight if you need to, and maintain a healthy weight. ?Keep all follow-up visits. This is important. ?If you are having surgery, make sure to tell your health care provider that you have sleep apnea. You may need to bring your device with you. ?Where to find more information ?Learn more about sleep apnea and daytime fatigue from: ?American Sleep Association: sleepassociation.org ?National Sleep Foundation: sleepfoundation.org ?National Heart, Lung, and Blood Institute: [employer]DryCleaner.gl ?Summary ?Sleep apnea is a condition in which breathing pauses or becomes shallow during sleep. ?Sleep apnea can cause daytime fatigue and other serious health conditions. ?You may need to wear a device while sleeping to help keep your airway open. ?If you are having surgery, make sure to tell your health care provider that you have sleep apnea. You may need to bring your device with you. ?Making changes to sleep habits, diet, lifestyle, and activity can help you manage sleep apnea. ?This information is not intended to replace advice given to you by your health care provider. Make sure you discuss any questions you have with your health care provider. ?Document Revised: 09/11/2020 Document Reviewed: 01/12/2020 ?Elsevier Patient Education ? 2023 Elsevier Inc. ? ?

## 2021-07-01 ENCOUNTER — Encounter: Payer: Self-pay | Admitting: Family Medicine

## 2021-07-01 ENCOUNTER — Other Ambulatory Visit (INDEPENDENT_AMBULATORY_CARE_PROVIDER_SITE_OTHER): Payer: BC Managed Care – PPO

## 2021-07-01 DIAGNOSIS — E785 Hyperlipidemia, unspecified: Secondary | ICD-10-CM | POA: Diagnosis not present

## 2021-07-01 LAB — LIPID PANEL
Cholesterol: 164 mg/dL (ref 0–200)
HDL: 39.2 mg/dL (ref >39.00)
NonHDL: 125.07
Total CHOL/HDL Ratio: 4
Triglycerides: 260 mg/dL — ABNORMAL HIGH (ref 0.0–149.0)
VLDL: 52 mg/dL — ABNORMAL HIGH (ref 0.0–40.0)

## 2021-07-01 LAB — LDL CHOLESTEROL, DIRECT: Direct LDL: 99 mg/dL

## 2021-07-03 DIAGNOSIS — Z630 Problems in relationship with spouse or partner: Secondary | ICD-10-CM | POA: Diagnosis not present

## 2021-07-03 DIAGNOSIS — F411 Generalized anxiety disorder: Secondary | ICD-10-CM | POA: Diagnosis not present

## 2021-07-11 ENCOUNTER — Other Ambulatory Visit: Payer: Self-pay | Admitting: Family Medicine

## 2021-07-11 DIAGNOSIS — E785 Hyperlipidemia, unspecified: Secondary | ICD-10-CM

## 2021-08-25 ENCOUNTER — Other Ambulatory Visit (INDEPENDENT_AMBULATORY_CARE_PROVIDER_SITE_OTHER): Payer: BC Managed Care – PPO

## 2021-08-25 DIAGNOSIS — E785 Hyperlipidemia, unspecified: Secondary | ICD-10-CM | POA: Diagnosis not present

## 2021-08-25 LAB — LIPID PANEL
Cholesterol: 147 mg/dL (ref 0–200)
HDL: 33.7 mg/dL — ABNORMAL LOW (ref >39.00)
NonHDL: 113.1
Total CHOL/HDL Ratio: 4
Triglycerides: 267 mg/dL — ABNORMAL HIGH (ref 0.0–149.0)
VLDL: 53.4 mg/dL — ABNORMAL HIGH (ref 0.0–40.0)

## 2021-08-25 LAB — LDL CHOLESTEROL, DIRECT: Direct LDL: 90 mg/dL

## 2021-09-02 ENCOUNTER — Ambulatory Visit: Payer: BC Managed Care – PPO

## 2021-09-02 DIAGNOSIS — G4733 Obstructive sleep apnea (adult) (pediatric): Secondary | ICD-10-CM

## 2021-09-02 DIAGNOSIS — R0683 Snoring: Secondary | ICD-10-CM

## 2021-09-15 ENCOUNTER — Encounter: Payer: Self-pay | Admitting: Pulmonary Disease

## 2021-10-01 ENCOUNTER — Telehealth: Payer: Self-pay | Admitting: Pulmonary Disease

## 2021-10-01 DIAGNOSIS — G4733 Obstructive sleep apnea (adult) (pediatric): Secondary | ICD-10-CM

## 2021-10-01 NOTE — Telephone Encounter (Signed)
Called and spoke with patient, advised that Dr. 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  Advised he will be back in the office tomorrow.  He verbalized understanding.  Will hold in triage until results are ready.

## 2021-10-04 ENCOUNTER — Telehealth: Payer: Self-pay | Admitting: Pulmonary Disease

## 2021-10-04 DIAGNOSIS — G4733 Obstructive sleep apnea (adult) (pediatric): Secondary | ICD-10-CM | POA: Diagnosis not present

## 2021-10-04 NOTE — Telephone Encounter (Signed)
Call patient  Sleep study result  Date of study: 09/02/2021  Impression: Mild obstructive sleep apnea Mild oxygen desaturations  Recommendation: Options of treatment for mild obstructive sleep apnea will include  1.  CPAP therapy if there is significant daytime sleepiness or other comorbidities including history of CVA or cardiac disease  -If CPAP is chosen as an option of treatment auto titrating CPAP with a pressure setting of 5-15 will be appropriate  2.  Watchful waiting with emphasis on weight loss measures, sleep position modification to optimize lateral sleep, elevating the head of the bed by about 30 degrees may also help.  3.  An oral device may be fashioned for the treatment of mild sleep disordered breathing, will involve referral to dentist.  Follow-up as previously scheduled

## 2021-10-06 NOTE — Telephone Encounter (Signed)
Note Call patient   Sleep study result   Date of study: 09/02/2021   Impression: Mild obstructive sleep apnea Mild oxygen desaturations   Recommendation: Options of treatment for mild obstructive sleep apnea will include   1.  CPAP therapy if there is significant daytime sleepiness or other comorbidities including history of CVA or cardiac disease   -If CPAP is chosen as an option of treatment auto titrating CPAP with a pressure setting of 5-15 will be appropriate   2.  Watchful waiting with emphasis on weight loss measures, sleep position modification to optimize lateral sleep, elevating the head of the bed by about 30 degrees may also help.   3.  An oral device may be fashioned for the treatment of mild sleep disordered breathing, will involve referral to dentist.   Follow-up as previously scheduled   Spoke with the pt and notified of results of sleep study. Pt verbalized understanding and was agreeable to CPAP therapy. I have placed DME referral for this and pt aware to contact the office for 31-90 day f/u once they begin using machine per insurance requirement.

## 2021-10-06 NOTE — Telephone Encounter (Signed)
Spoke with the pt and notified of results of sleep study. Pt verbalized understanding and was agreeable to CPAP therapy. I have placed DME referral for this and pt aware to contact the office for 31-90 day f/u once they begin using machine per insurance requirement.   

## 2021-12-19 DIAGNOSIS — E669 Obesity, unspecified: Secondary | ICD-10-CM | POA: Diagnosis not present

## 2021-12-19 DIAGNOSIS — Z0000 Encounter for general adult medical examination without abnormal findings: Secondary | ICD-10-CM | POA: Diagnosis not present

## 2021-12-19 DIAGNOSIS — Z8709 Personal history of other diseases of the respiratory system: Secondary | ICD-10-CM | POA: Diagnosis not present

## 2022-03-25 ENCOUNTER — Other Ambulatory Visit (HOSPITAL_BASED_OUTPATIENT_CLINIC_OR_DEPARTMENT_OTHER): Payer: Self-pay

## 2022-03-25 ENCOUNTER — Ambulatory Visit: Payer: BC Managed Care – PPO | Admitting: Family Medicine

## 2022-03-25 ENCOUNTER — Encounter: Payer: Self-pay | Admitting: Family Medicine

## 2022-03-25 VITALS — BP 110/64 | HR 76 | Temp 97.8°F | Ht 65.0 in | Wt 203.4 lb

## 2022-03-25 DIAGNOSIS — H6991 Unspecified Eustachian tube disorder, right ear: Secondary | ICD-10-CM

## 2022-03-25 DIAGNOSIS — R432 Parageusia: Secondary | ICD-10-CM | POA: Diagnosis not present

## 2022-03-25 LAB — BASIC METABOLIC PANEL
BUN: 17 mg/dL (ref 6–23)
CO2: 24 mEq/L (ref 19–32)
Calcium: 9.6 mg/dL (ref 8.4–10.5)
Chloride: 103 mEq/L (ref 96–112)
Creatinine, Ser: 1.07 mg/dL (ref 0.40–1.50)
GFR: 89.77 mL/min (ref >60.00)
Glucose, Bld: 91 mg/dL (ref 70–99)
Potassium: 3.9 mEq/L (ref 3.5–5.1)
Sodium: 138 mEq/L (ref 135–145)

## 2022-03-25 MED ORDER — FLUTICASONE PROPIONATE 50 MCG/ACT NA SUSP
2.0000 | Freq: Every day | NASAL | 6 refills | Status: DC
  Filled 2022-03-25: qty 16, 30d supply, fill #0

## 2022-03-25 NOTE — Patient Instructions (Addendum)
Stay hydrated.  Try to drink 55-60 oz of water daily outside of exercise.  Flonase (fluticasone); nasal spray that is over the counter. 2 sprays each nostril, once daily. Aim towards the same side eye when you spray.  This is OTC.   Let us know if you need anything.

## 2022-03-25 NOTE — Progress Notes (Signed)
Chief Complaint  Patient presents with   Tinnitus    Salty taste in mouth    Subjective: Patient is a 37 y.o. male here for salty taste in mouth.  Over the past week, the patient has had a salty taste in his mouth.  It is unrelated to what he eats.  He is known as a dry mouth and is also dealing with some dental issues.  Drinking more Gatorade seems to have helped.  He has no runny or stuffy nose.  He denies recent trauma to his head.  Yesterday, the patient noticed a loud ringing in his ear and along with fullness and slightly decreased hearing.  No pain, fevers, or drainage from the ear.  He has not tried anything at home so far.  Past Medical History:  Diagnosis Date   Absent kidney, congenital    Anxiety     Objective: BP 110/64 (BP Location: Left Arm, Patient Position: Sitting, Cuff Size: Normal)   Pulse 76   Temp 97.8 F (36.6 C) (Oral)   Ht 5\' 5"  (1.651 m)   Wt 203 lb 6 oz (92.3 kg)   SpO2 99%   BMI 33.84 kg/m  General: Awake, appears stated age HEENT: Canals are patent without otorrhea, TMs negative bilaterally, nares are patent without rhinorrhea, MMM, no pharyngeal exudate or erythema, no mucosal lesions noted Heart: RRR Lungs: CTAB, no rales, wheezes or rhonchi. No accessory muscle use Psych: Age appropriate judgment and insight, normal affect and mood  Assessment and Plan: Dysgeusia - Plan: Basic metabolic panel, fluticasone (FLONASE) 50 MCG/ACT nasal spray  Dysfunction of right eustachian tube - Plan: fluticasone (FLONASE) 50 MCG/ACT nasal spray  Trial of Flonase for both issues.  Stay hydrated.  Check a BMP to look at electrolytes.  If no improvement, will consider 5-day prednisone burst and referral to ENT if still no better. The patient voiced understanding and agreement to the plan.  [place], DO 03/25/22  1:16 PM

## 2022-05-27 ENCOUNTER — Ambulatory Visit: Payer: BC Managed Care – PPO | Admitting: Family Medicine

## 2022-06-05 ENCOUNTER — Ambulatory Visit (INDEPENDENT_AMBULATORY_CARE_PROVIDER_SITE_OTHER): Payer: BC Managed Care – PPO | Admitting: Family Medicine

## 2022-06-05 ENCOUNTER — Encounter: Payer: Self-pay | Admitting: Family Medicine

## 2022-06-05 VITALS — BP 108/64 | HR 62 | Temp 97.8°F | Ht 65.0 in | Wt 203.1 lb

## 2022-06-05 DIAGNOSIS — H9313 Tinnitus, bilateral: Secondary | ICD-10-CM

## 2022-06-05 DIAGNOSIS — Z0000 Encounter for general adult medical examination without abnormal findings: Secondary | ICD-10-CM | POA: Diagnosis not present

## 2022-06-05 LAB — COMPREHENSIVE METABOLIC PANEL
ALT: 44 U/L (ref 0–53)
AST: 23 U/L (ref 0–37)
Albumin: 4.5 g/dL (ref 3.5–5.2)
Alkaline Phosphatase: 57 U/L (ref 39–117)
BUN: 14 mg/dL (ref 6–23)
CO2: 26 mEq/L (ref 19–32)
Calcium: 9.5 mg/dL (ref 8.4–10.5)
Chloride: 104 mEq/L (ref 96–112)
Creatinine, Ser: 1.1 mg/dL (ref 0.40–1.50)
GFR: 86.72 mL/min (ref >60.00)
Glucose, Bld: 97 mg/dL (ref 70–99)
Potassium: 4.1 mEq/L (ref 3.5–5.1)
Sodium: 139 mEq/L (ref 135–145)
Total Bilirubin: 0.6 mg/dL (ref 0.2–1.2)
Total Protein: 6.8 g/dL (ref 6.0–8.3)

## 2022-06-05 LAB — CBC
HCT: 44.5 % (ref 39.0–52.0)
Hemoglobin: 15.3 g/dL (ref 13.0–17.0)
MCHC: 34.5 g/dL (ref 30.0–36.0)
MCV: 93 fl (ref 78.0–100.0)
Platelets: 219 10*3/uL (ref 150.0–400.0)
RBC: 4.78 Mil/uL (ref 4.22–5.81)
RDW: 12.9 % (ref 11.5–15.5)
WBC: 5.5 10*3/uL (ref 4.0–10.5)

## 2022-06-05 LAB — LIPID PANEL
Cholesterol: 180 mg/dL (ref 0–200)
HDL: 36.6 mg/dL — ABNORMAL LOW (ref >39.00)
NonHDL: 143.03
Total CHOL/HDL Ratio: 5
Triglycerides: 219 mg/dL — ABNORMAL HIGH (ref 0.0–149.0)
VLDL: 43.8 mg/dL — ABNORMAL HIGH (ref 0.0–40.0)

## 2022-06-05 LAB — LDL CHOLESTEROL, DIRECT: Direct LDL: 121 mg/dL

## 2022-06-05 NOTE — Patient Instructions (Addendum)
Give us 2-3 business days to get the results of your labs back.   Keep the diet clean and stay active.  Do monthly self testicular checks in the shower. You are feeling for lumps/bumps that don't belong. If you feel anything like this, let me know!  Please get me a copy of your advanced directive form at your convenience.   Let us know if you need anything.  
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
## 2022-06-08 ENCOUNTER — Other Ambulatory Visit: Payer: Self-pay | Admitting: Family Medicine

## 2022-06-08 DIAGNOSIS — E785 Hyperlipidemia, unspecified: Secondary | ICD-10-CM

## 2022-06-08 NOTE — Telephone Encounter (Signed)
Error

## 2022-06-08 NOTE — Telephone Encounter (Deleted)
Caller Name Jaideep Pollack Caller Phone Number [phone] Patient Name Duane Oneal Patient DOB 07/10/1962 Call Type Message Only Information Provided Reason for Call Request to Reschedule Office Appointment Initial Comment Caller states they have an 830 am lab appointment . They tested positive for covid on Saturday. Does he need to reschedule ? Disp. Time Disposition Final User 06/08/2022 7:54:38 AM General Information Provided Yes Talmadge Coventry Call Closed By: Talmadge Coventry Transaction Date/Time: 06/08/2022 7:52:08 AM (ET)

## 2022-06-24 DIAGNOSIS — H9311 Tinnitus, right ear: Secondary | ICD-10-CM | POA: Diagnosis not present

## 2022-07-20 ENCOUNTER — Other Ambulatory Visit (INDEPENDENT_AMBULATORY_CARE_PROVIDER_SITE_OTHER): Payer: BC Managed Care – PPO

## 2022-07-20 DIAGNOSIS — E785 Hyperlipidemia, unspecified: Secondary | ICD-10-CM

## 2022-07-20 LAB — LIPID PANEL
Cholesterol: 171 mg/dL (ref 0–200)
HDL: 36.1 mg/dL — ABNORMAL LOW (ref >39.00)
LDL Cholesterol: 96 mg/dL (ref 0–99)
NonHDL: 134.88
Total CHOL/HDL Ratio: 5
Triglycerides: 192 mg/dL — ABNORMAL HIGH (ref 0.0–149.0)
VLDL: 38.4 mg/dL (ref 0.0–40.0)

## 2022-10-21 ENCOUNTER — Other Ambulatory Visit: Payer: Self-pay

## 2022-10-21 ENCOUNTER — Emergency Department (HOSPITAL_BASED_OUTPATIENT_CLINIC_OR_DEPARTMENT_OTHER): Payer: BC Managed Care – PPO

## 2022-10-21 ENCOUNTER — Observation Stay (HOSPITAL_BASED_OUTPATIENT_CLINIC_OR_DEPARTMENT_OTHER)
Admission: EM | Admit: 2022-10-21 | Discharge: 2022-10-22 | Disposition: A | Discharge status: Home / Self Care | Payer: BC Managed Care – PPO | Attending: Internal Medicine | Admitting: Internal Medicine

## 2022-10-21 DIAGNOSIS — N200 Calculus of kidney: Secondary | ICD-10-CM | POA: Diagnosis not present

## 2022-10-21 DIAGNOSIS — N202 Calculus of kidney with calculus of ureter: Principal | ICD-10-CM | POA: Insufficient documentation

## 2022-10-21 DIAGNOSIS — N1330 Unspecified hydronephrosis: Secondary | ICD-10-CM | POA: Diagnosis not present

## 2022-10-21 DIAGNOSIS — Q600 Renal agenesis, unilateral: Secondary | ICD-10-CM

## 2022-10-21 DIAGNOSIS — N281 Cyst of kidney, acquired: Secondary | ICD-10-CM | POA: Insufficient documentation

## 2022-10-21 DIAGNOSIS — E871 Hypo-osmolality and hyponatremia: Secondary | ICD-10-CM

## 2022-10-21 DIAGNOSIS — R319 Hematuria, unspecified: Secondary | ICD-10-CM | POA: Diagnosis not present

## 2022-10-21 DIAGNOSIS — R109 Unspecified abdominal pain: Secondary | ICD-10-CM | POA: Diagnosis not present

## 2022-10-21 DIAGNOSIS — N201 Calculus of ureter: Secondary | ICD-10-CM | POA: Diagnosis not present

## 2022-10-21 LAB — CBC WITH DIFFERENTIAL/PLATELET
Abs Immature Granulocytes: 0.02 10*3/uL (ref 0.00–0.07)
Basophils Absolute: 0.1 10*3/uL (ref 0.0–0.1)
Basophils Relative: 1 %
Eosinophils Absolute: 0.1 10*3/uL (ref 0.0–0.5)
Eosinophils Relative: 2 %
HCT: 43.4 % (ref 39.0–52.0)
Hemoglobin: 15.1 g/dL (ref 13.0–17.0)
Immature Granulocytes: 0 %
Lymphocytes Relative: 31 %
Lymphs Abs: 2.4 10*3/uL (ref 0.7–4.0)
MCH: 31.6 pg (ref 26.0–34.0)
MCHC: 34.8 g/dL (ref 30.0–36.0)
MCV: 90.8 fL (ref 80.0–100.0)
Monocytes Absolute: 0.5 10*3/uL (ref 0.1–1.0)
Monocytes Relative: 6 %
Neutro Abs: 4.9 10*3/uL (ref 1.7–7.7)
Neutrophils Relative %: 60 %
Platelets: 190 10*3/uL (ref 150–400)
RBC: 4.78 MIL/uL (ref 4.22–5.81)
RDW: 11.7 % (ref 11.5–15.5)
WBC: 8 10*3/uL (ref 4.0–10.5)
nRBC: 0 % (ref 0.0–0.2)

## 2022-10-21 LAB — HEPATIC FUNCTION PANEL
ALT: 53 U/L — ABNORMAL HIGH (ref 0–44)
AST: 32 U/L (ref 15–41)
Albumin: 4.8 g/dL (ref 3.5–5.0)
Alkaline Phosphatase: 57 U/L (ref 38–126)
Bilirubin, Direct: 0.2 mg/dL (ref 0.0–0.2)
Indirect Bilirubin: 0.7 mg/dL (ref 0.3–0.9)
Total Bilirubin: 0.9 mg/dL (ref 0.3–1.2)
Total Protein: 7.8 g/dL (ref 6.5–8.1)

## 2022-10-21 LAB — URINALYSIS, ROUTINE W REFLEX MICROSCOPIC
Glucose, UA: NEGATIVE mg/dL
Ketones, ur: NEGATIVE mg/dL
Leukocytes,Ua: NEGATIVE
Nitrite: NEGATIVE
Protein, ur: 100 mg/dL — AB
Specific Gravity, Urine: >=1.03 (ref 1.005–1.030)
pH: 5.5 (ref 5.0–8.0)

## 2022-10-21 LAB — BASIC METABOLIC PANEL
Anion gap: 13 (ref 5–15)
BUN: 15 mg/dL (ref 6–20)
CO2: 23 mmol/L (ref 22–32)
Calcium: 9.1 mg/dL (ref 8.9–10.3)
Chloride: 98 mmol/L (ref 98–111)
Creatinine, Ser: 1.18 mg/dL (ref 0.61–1.24)
GFR, Estimated: >60 mL/min (ref >60)
Glucose, Bld: 98 mg/dL (ref 70–99)
Potassium: 4 mmol/L (ref 3.5–5.1)
Sodium: 134 mmol/L — ABNORMAL LOW (ref 135–145)

## 2022-10-21 LAB — URINALYSIS, MICROSCOPIC (REFLEX): RBC / HPF: >50 RBC/hpf (ref 0–5)

## 2022-10-21 LAB — LIPASE, BLOOD: Lipase: 36 U/L (ref 11–51)

## 2022-10-21 MED ORDER — CIPROFLOXACIN IN D5W 400 MG/200ML IV SOLN
400.0000 mg | Freq: Once | INTRAVENOUS | Status: AC
  Administered 2022-10-21: 400 mg via INTRAVENOUS
  Filled 2022-10-21: qty 200

## 2022-10-21 MED ORDER — ACETAMINOPHEN 325 MG PO TABS: 650.0000 mg | ORAL_TABLET | Freq: Four times a day (QID) | ORAL | Status: DC | PRN

## 2022-10-21 MED ORDER — HYDROCODONE-ACETAMINOPHEN 5-325 MG PO TABS
1.0000 | ORAL_TABLET | Freq: Four times a day (QID) | ORAL | Status: DC | PRN
  Administered 2022-10-22: 1 via ORAL
  Filled 2022-10-21: qty 1

## 2022-10-21 MED ORDER — LACTATED RINGERS IV BOLUS
500.0000 mL | Freq: Once | INTRAVENOUS | Status: AC
  Administered 2022-10-21: 500 mL via INTRAVENOUS

## 2022-10-21 MED ORDER — CIPROFLOXACIN IN D5W 400 MG/200ML IV SOLN
400.0000 mg | Freq: Two times a day (BID) | INTRAVENOUS | Status: DC
  Administered 2022-10-22: 400 mg via INTRAVENOUS
  Filled 2022-10-21: qty 200
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
## 2022-10-21 NOTE — ED Notes (Signed)
Lab called and stated blood was hemolyzed.  Reordered labs

## 2022-10-21 NOTE — Assessment & Plan Note (Signed)
W/ 2 stones in the mid to distal right ureter with largest stone measuring up to 5 mm, with mild upstream hydroureteronephrosis and trace perinephric stranding see on CT imaging -hx of congential solitary kidney -creatinine stable at 1.18 on presentation -UA negative -Urologist Dr. Sherron Monday consulted by ED physician and recommended prophylactic IV ciprofloxacin and possible stenting in the morning. -continue IV Ciprofloxacin  -NPO past midnight

## 2022-10-21 NOTE — ED Triage Notes (Signed)
Reports he has hx of unilateral kidney since childhood and had some right sided flank pain Monday and earlier today his urine was very dark and concerns blood.

## 2022-10-21 NOTE — Progress Notes (Signed)
Pharmacy Antibiotic Note  Duane Oneal is a 37 y.o. male for which pharmacy has been consulted for  ciprofloxacin  dosing for UTI.  Patient with a history of solitary kidney, anxiety. Patient presenting with right-sided flank pain.  Estimated Creatinine Clearance: 89 mL/min (by C-G formula based on SCr of 1.18 mg/dL). WBC 8; HR 94; RR 16  Plan: Ciprofloxacin IV 400mg  q12h --Can likely de-escalate to cephalosporin --Utilize PO when able Monitor WBC, fever, renal function, cultures  Height: 5\' 5"  (165.1 cm) Weight: 89.4 kg (197 lb) IBW/kg (Calculated) : 61.5  No data recorded.  Recent Labs  Lab 10/21/22 1818  WBC 8.0  CREATININE 1.18    Estimated Creatinine Clearance: 89 mL/min (by C-G formula based on SCr of 1.18 mg/dL).    Allergies  Allergen Reactions   Morphine And Codeine Itching   Microbiology results: Pending  Thank you for allowing pharmacy to be a part of this patient's care.  Delmar Landau, PharmD, BCPS 10/21/2022 7:55 PM ED Clinical Pharmacist -  [phone]
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
## 2022-10-21 NOTE — ED Notes (Signed)
[facility] truck called out for pick up , nurse notified @ 20:10

## 2022-10-21 NOTE — Assessment & Plan Note (Signed)
-  mild -500cc LR bolus ordered -follow repeat in the morning

## 2022-10-21 NOTE — ED Notes (Signed)
[facility] reached out for transport @ 20:05 No Current ETA.Marland KitchenMarland Kitchen

## 2022-10-22 ENCOUNTER — Observation Stay (HOSPITAL_COMMUNITY): Payer: BC Managed Care – PPO | Admitting: Certified Registered"

## 2022-10-22 ENCOUNTER — Observation Stay (HOSPITAL_COMMUNITY): Payer: BC Managed Care – PPO

## 2022-10-22 ENCOUNTER — Encounter (HOSPITAL_COMMUNITY): Payer: Self-pay | Admitting: Family Medicine

## 2022-10-22 ENCOUNTER — Other Ambulatory Visit: Payer: Self-pay | Admitting: Internal Medicine

## 2022-10-22 ENCOUNTER — Encounter (HOSPITAL_COMMUNITY)
Admission: EM | Disposition: A | Discharge status: Home / Self Care | Payer: Self-pay | Source: Home / Self Care | Attending: Emergency Medicine

## 2022-10-22 DIAGNOSIS — Q600 Renal agenesis, unilateral: Secondary | ICD-10-CM | POA: Diagnosis not present

## 2022-10-22 DIAGNOSIS — N202 Calculus of kidney with calculus of ureter: Secondary | ICD-10-CM | POA: Diagnosis not present

## 2022-10-22 DIAGNOSIS — N200 Calculus of kidney: Secondary | ICD-10-CM | POA: Diagnosis not present

## 2022-10-22 DIAGNOSIS — N201 Calculus of ureter: Secondary | ICD-10-CM | POA: Diagnosis not present

## 2022-10-22 HISTORY — PX: CYSTOSCOPY W/ URETERAL STENT PLACEMENT: SHX1429

## 2022-10-22 LAB — SURGICAL PCR SCREEN
MRSA, PCR: NEGATIVE
Staphylococcus aureus: NEGATIVE

## 2022-10-22 LAB — BASIC METABOLIC PANEL
Anion gap: 10 (ref 5–15)
BUN: 12 mg/dL (ref 6–20)
CO2: 24 mmol/L (ref 22–32)
Calcium: 9.5 mg/dL (ref 8.9–10.3)
Chloride: 105 mmol/L (ref 98–111)
Creatinine, Ser: 1.12 mg/dL (ref 0.61–1.24)
GFR, Estimated: >60 mL/min (ref >60)
Glucose, Bld: 100 mg/dL — ABNORMAL HIGH (ref 70–99)
Potassium: 3.9 mmol/L (ref 3.5–5.1)
Sodium: 139 mmol/L (ref 135–145)

## 2022-10-22 LAB — HIV ANTIBODY (ROUTINE TESTING W REFLEX): HIV Screen 4th Generation wRfx: NONREACTIVE

## 2022-10-22 SURGERY — CYSTOSCOPY, WITH RETROGRADE PYELOGRAM AND URETERAL STENT INSERTION
Anesthesia: General | Site: Pelvis | Laterality: Right

## 2022-10-22 MED ORDER — OXYCODONE HCL 5 MG/5ML PO SOLN: 5.0000 mg | Freq: Once | ORAL | Status: AC | PRN

## 2022-10-22 MED ORDER — LACTATED RINGERS IV SOLN: INTRAVENOUS | Status: DC

## 2022-10-22 MED ORDER — PROPOFOL 10 MG/ML IV BOLUS
INTRAVENOUS | Status: AC
  Filled 2022-10-22: qty 20

## 2022-10-22 MED ORDER — ACETAMINOPHEN 10 MG/ML IV SOLN
1000.0000 mg | Freq: Once | INTRAVENOUS | Status: DC | PRN
  Administered 2022-10-22: 1000 mg via INTRAVENOUS

## 2022-10-22 MED ORDER — ACETAMINOPHEN 500 MG PO TABS: 1000.0000 mg | ORAL_TABLET | Freq: Once | ORAL | Status: DC | PRN

## 2022-10-22 MED ORDER — OXYCODONE HCL 5 MG PO TABS
5.0000 mg | ORAL_TABLET | Freq: Once | ORAL | Status: AC | PRN
  Administered 2022-10-22: 5 mg via ORAL

## 2022-10-22 MED ORDER — LACTATED RINGERS IV SOLN
INTRAVENOUS | Status: DC | PRN
Start: 2022-10-22 — End: 2022-10-22

## 2022-10-22 MED ORDER — HYDROCODONE-ACETAMINOPHEN 5-325 MG PO TABS: 1.0000 | ORAL_TABLET | Freq: Four times a day (QID) | ORAL | 0 refills | Status: DC | PRN

## 2022-10-22 MED ORDER — CHLORHEXIDINE GLUCONATE 0.12 % MT SOLN
15.0000 mL | Freq: Once | OROMUCOSAL | Status: AC
  Administered 2022-10-22: 15 mL via OROMUCOSAL

## 2022-10-22 MED ORDER — OXYCODONE-ACETAMINOPHEN 5-325 MG PO TABS
1.0000 | ORAL_TABLET | Freq: Four times a day (QID) | ORAL | 0 refills | Status: AC | PRN
Start: 2022-10-22 — End: 2022-10-27

## 2022-10-22 MED ORDER — OXYCODONE HCL 5 MG PO TABS
ORAL_TABLET | ORAL | Status: AC
  Filled 2022-10-22: qty 1

## 2022-10-22 MED ORDER — ACETAMINOPHEN 10 MG/ML IV SOLN
INTRAVENOUS | Status: AC
  Filled 2022-10-22: qty 100

## 2022-10-22 MED ORDER — FENTANYL CITRATE (PF) 100 MCG/2ML IJ SOLN
INTRAMUSCULAR | Status: DC | PRN
  Administered 2022-10-22 (×2): 50 ug via INTRAVENOUS

## 2022-10-22 MED ORDER — STERILE WATER FOR IRRIGATION IR SOLN
Status: DC | PRN
  Administered 2022-10-22: 3000 mL

## 2022-10-22 MED ORDER — MIDAZOLAM HCL 2 MG/2ML IJ SOLN
INTRAMUSCULAR | Status: AC
  Filled 2022-10-22: qty 2

## 2022-10-22 MED ORDER — ACETAMINOPHEN 160 MG/5ML PO SOLN: 1000.0000 mg | Freq: Once | ORAL | Status: DC | PRN

## 2022-10-22 MED ORDER — FENTANYL CITRATE PF 50 MCG/ML IJ SOSY: 25.0000 ug | PREFILLED_SYRINGE | INTRAMUSCULAR | Status: DC | PRN

## 2022-10-22 MED ORDER — LIDOCAINE HCL URETHRAL/MUCOSAL 2 % EX GEL
CUTANEOUS | Status: AC
  Filled 2022-10-22: qty 30

## 2022-10-22 MED ORDER — FENTANYL CITRATE (PF) 100 MCG/2ML IJ SOLN
INTRAMUSCULAR | Status: AC
  Filled 2022-10-22: qty 2

## 2022-10-22 MED ORDER — PROPOFOL 10 MG/ML IV BOLUS
INTRAVENOUS | Status: DC | PRN
Start: 2022-10-22 — End: 2022-10-22
  Administered 2022-10-22: 200 mg via INTRAVENOUS

## 2022-10-22 MED ORDER — IOHEXOL 300 MG/ML  SOLN
INTRAMUSCULAR | Status: DC | PRN
  Administered 2022-10-22: 5 mL

## 2022-10-22 MED ORDER — DEXAMETHASONE SODIUM PHOSPHATE 10 MG/ML IJ SOLN
INTRAMUSCULAR | Status: DC | PRN
  Administered 2022-10-22: 5 mg via INTRAVENOUS

## 2022-10-22 MED ORDER — ONDANSETRON HCL 4 MG/2ML IJ SOLN
INTRAMUSCULAR | Status: DC | PRN
  Administered 2022-10-22: 4 mg via INTRAVENOUS

## 2022-10-22 MED ORDER — TAMSULOSIN HCL 0.4 MG PO CAPS: 0.4000 mg | ORAL_CAPSULE | Freq: Every day | ORAL | 0 refills | Status: AC

## 2022-10-22 SURGICAL SUPPLY — 11 items
BAG URO CATCHER STRL LF (MISCELLANEOUS) ×2 IMPLANT
CATH URETL OPEN END 6FR 70 (CATHETERS) ×2 IMPLANT
CLOTH BEACON ORANGE TIMEOUT ST (SAFETY) ×2 IMPLANT
GLOVE SURG LX STRL 7.5 STRW (GLOVE) ×2 IMPLANT
GOWN STRL REUS W/ TWL XL LVL3 (GOWN DISPOSABLE) ×4 IMPLANT
GOWN STRL REUS W/TWL XL LVL3 (GOWN DISPOSABLE) ×2
GUIDEWIRE STR DUAL SENSOR (WIRE) ×2 IMPLANT
MANIFOLD NEPTUNE II (INSTRUMENTS) ×2 IMPLANT
PACK CYSTO (CUSTOM PROCEDURE TRAY) ×2 IMPLANT
STENT URET 6FRX24 CONTOUR (STENTS) IMPLANT
TUBING CONNECTING 10 (TUBING) IMPLANT

## 2022-10-22 NOTE — Anesthesia Procedure Notes (Signed)
Procedure Name: LMA Insertion Date/Time: 10/22/2022 10:54 AM  Performed by: Deri Fuelling, CRNAPre-anesthesia Checklist: Patient identified, Emergency Drugs available, Suction available and Patient being monitored Patient Re-evaluated:Patient Re-evaluated prior to induction Oxygen Delivery Method: Circle system utilized Preoxygenation: Pre-oxygenation with 100% oxygen Induction Type: IV induction Ventilation: Mask ventilation without difficulty LMA: LMA inserted LMA Size: 4.0 Tube type: Oral Number of attempts: 1 Airway Equipment and Method: Stylet and Oral airway Placement Confirmation: ETT inserted through vocal cords under direct vision, positive ETCO2 and breath sounds checked- equal and bilateral Tube secured with: Tape Dental Injury: Teeth and Oropharynx as per pre-operative assessment
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
## 2022-10-22 NOTE — Progress Notes (Signed)
CCC Pre-op Review  Pre-op checklist: asked rn to complete  NPO: yes   Labs: PCR in process, labs good  Consent: no orders  H&P: yes  Vitals: stable  O2 requirements: none  MAR/PTA review: cipro at 0743  IV: right arm,  Floor nurse name:    Additional info:

## 2022-10-22 NOTE — Anesthesia Preprocedure Evaluation (Signed)
Anesthesia Evaluation  Patient identified by MRN, date of birth, ID band Patient awake    Reviewed: Allergy & Precautions, NPO status , Patient's Chart, lab work & pertinent test results  History of Anesthesia Complications Negative for: history of anesthetic complications  Airway Mallampati: II  TM Distance: >3 FB Neck ROM: Full    Dental  (+) Teeth Intact, Dental Advisory Given   Pulmonary neg pulmonary ROS   breath sounds clear to auscultation       Cardiovascular negative cardio ROS  Rhythm:Regular     Neuro/Psych negative neurological ROS     GI/Hepatic negative GI ROS, Neg liver ROS,,,  Endo/Other  negative endocrine ROS    Renal/GU Renal diseaseRight ureteral stone, h/o single kidney at birth     Musculoskeletal negative musculoskeletal ROS (+)    Abdominal   Peds  Hematology negative hematology ROS (+) Lab Results      Component                Value               Date                      WBC                      8.0                 10/21/2022                HGB                      15.1                10/21/2022                HCT                      43.4                10/21/2022                MCV                      90.8                10/21/2022                PLT                      190                 10/21/2022              Anesthesia Other Findings   Reproductive/Obstetrics                             Anesthesia Physical Anesthesia Plan  ASA: 1  Anesthesia Plan: General   Post-op Pain Management: Ofirmev IV (intra-op)*   Induction: Intravenous  PONV Risk Score and Plan: 2 and Ondansetron and Dexamethasone  Airway Management Planned: LMA  Additional Equipment: None  Intra-op Plan:   Post-operative Plan: Extubation in OR  Informed Consent: I have reviewed the patients History and Physical, chart, labs and discussed the procedure including the risks,  benefits and alternatives for the proposed anesthesia with the patient or  authorized representative who has indicated his/her understanding and acceptance.     Dental advisory given  Plan Discussed with: CRNA  Anesthesia Plan Comments:        Anesthesia Quick Evaluation

## 2022-10-22 NOTE — Op Note (Signed)
Preoperative diagnosis: Right ureteral stones and single right kidney Postoperative diagnosis: Right ureteral stones and single right kidney Surgery: Cystoscopy right retrograde ureterogram and insertion of right ureteral stent Surgeon: Dr. Lorin Picket Christien Berthelot  Patient of the above diagnoses and consented the above procedure and was given antibiotics.  Extra care was taken with leg positioning  On cystoscopy patient had 1 right sided ureteral orifice and none on the left.  He only has a right kidney that is congenital in nature.  Bladder mucosa and trigone were normal.  Under fluoroscopic guidance I passed a sensor wire to the upper third of the ureter.  6 [place] open-ended ureteral catheter was passed over the wire.  Wire was removed.  5 cc of contrast was used to do a gentle retrograde ureterogram.  He had mild dilation of the calyces on the right side.  Sensor wire was passed curling in the middle pole calyx.  Ureteral catheter was removed.  24 cm x 6 French stent was placed under fluoroscopic and cystoscopic guidance curling in the bladder and kidney.  He will be followed as per protocol.  Bladder was emptied and the case

## 2022-10-22 NOTE — Progress Notes (Signed)
Pharmacy doesn't have hydrocodone. Sent another script for percocet.

## 2022-10-22 NOTE — Progress Notes (Signed)
Patient was given discharge instructions, and all questions were answered.  Patient was stable for discharge and was taken to the main exit by wheelchair. 
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
## 2022-10-22 NOTE — Transfer of Care (Signed)
Immediate Anesthesia Transfer of Care Note  Patient: Duane Oneal  Procedure(s) Performed: CYSTOSCOPY WITH  RETROGRADE PYELOGRAM/RIGHT URETERAL STENT PLACEMENT (Right: Pelvis)  Patient Location: PACU  Anesthesia Type:General  Level of Consciousness: sedated  Airway & Oxygen Therapy: Patient Spontanous Breathing and Patient connected to face mask oxygen  Post-op Assessment: Report given to RN and Post -op Vital signs reviewed and stable  Post vital signs: Reviewed and stable  Last Vitals:  Vitals Value Taken Time  BP 90/63 10/22/22 1109  Temp    Pulse 66 10/22/22 1110  Resp 15 10/22/22 1110  SpO2 95 % 10/22/22 1110  Vitals shown include unfiled device data.  Last Pain:  Vitals:   10/22/22 0859  TempSrc: Oral  PainSc: 0-No pain      Patients Stated Pain Goal: 5 (10/22/22 0859)  Complications: No notable events documented.

## 2022-10-22 NOTE — Interval H&P Note (Signed)
History and Physical Interval Note:  10/22/2022 9:20 AM  Duane Oneal  has presented today for surgery, with the diagnosis of RIGHT URETERAL STONE.  The various methods of treatment have been discussed with the patient and family. After consideration of risks, benefits and other options for treatment, the patient has consented to  Procedure(s): CYSTOSCOPY WITH RETROGRADE PYELOGRAM/URETERAL STENT PLACEMENT (Right) as a surgical intervention.  The patient's history has been reviewed, patient examined, no change in status, stable for surgery.  I have reviewed the patient's chart and labs.  Questions were answered to the patient's satisfaction.     Duane Oneal

## 2022-10-23 ENCOUNTER — Encounter (HOSPITAL_COMMUNITY): Payer: Self-pay | Admitting: Urology

## 2022-10-29 NOTE — Anesthesia Postprocedure Evaluation (Signed)
Anesthesia Post Note  Patient: Duane Oneal  Procedure(s) Performed: CYSTOSCOPY WITH  RETROGRADE PYELOGRAM/RIGHT URETERAL STENT PLACEMENT (Right: Pelvis)     Patient location during evaluation: PACU Anesthesia Type: General Level of consciousness: awake and alert Pain management: pain level controlled Vital Signs Assessment: post-procedure vital signs reviewed and stable Respiratory status: spontaneous breathing, nonlabored ventilation and respiratory function stable Cardiovascular status: blood pressure returned to baseline and stable Postop Assessment: no apparent nausea or vomiting Anesthetic complications: no   No notable events documented.  Last Vitals:  Vitals:   10/22/22 1303 10/22/22 1404  BP: 113/74 120/66  Pulse: 69 71  Resp: 18 18  Temp:    SpO2: 94% 97%    Last Pain:  Vitals:   10/22/22 1417  TempSrc:   PainSc: 2                  Quantez Schnyder

## 2022-11-02 ENCOUNTER — Ambulatory Visit: Payer: BC Managed Care – PPO | Admitting: Family Medicine

## 2022-11-02 ENCOUNTER — Encounter: Payer: Self-pay | Admitting: Family Medicine

## 2022-11-02 VITALS — BP 121/80 | HR 65 | Temp 99.0°F | Ht 65.0 in | Wt 199.2 lb

## 2022-11-02 DIAGNOSIS — N200 Calculus of kidney: Secondary | ICD-10-CM | POA: Diagnosis not present

## 2022-11-02 DIAGNOSIS — Z09 Encounter for follow-up examination after completed treatment for conditions other than malignant neoplasm: Secondary | ICD-10-CM | POA: Diagnosis not present

## 2022-11-02 NOTE — Progress Notes (Signed)
Chief Complaint  Patient presents with   ED follow-up    Subjective: Patient is a 37 y.o. male here for hospital follow-up.  The patient was admitted to [facility] on 10/21/2022 and discharged the following day.  He was found to have 2 large kidney stones.  He does have a solitary kidney.  He was admitted and underwent surgery with the urology team.  A stent was placed.  He is no longer requiring any pain medication and is urinating normally.  He is still taking Flomax 0.4 mg daily and his last dose will be on Thursday.  He is eating and drinking normally, no nausea or vomiting.  He did quit sodas over a month ago.  As far as the cause, he thinks working in [employer] during the summertime perhaps dehydrated him and led to stone development.  Past Medical History:  Diagnosis Date   Absent kidney, congenital    Anxiety     Objective: BP 121/80 (BP Location: Left Arm, Patient Position: Sitting, Cuff Size: Normal)   Pulse 65   Temp 99 F (37.2 C) (Oral)   Ht 5\' 5"  (1.651 m)   Wt 199 lb 4 oz (90.4 kg)   SpO2 99%   BMI 33.16 kg/m  General: Awake, appears stated age Heart: RRR, no LE edema Lungs: CTAB, no rales, wheezes or rhonchi. No accessory muscle use Abdomen: Bowel sounds present, soft, nontender, nondistended MSK: No CVA TTP Psych: Age appropriate judgment and insight, normal affect and mood  Assessment and Plan: Hospital discharge follow-up  Nephrolithiasis  Labs reviewed.  No need for recheck of anything.  He is no longer on pain medication.  He will finish the Flomax through the next 3 days.  He will follow-up with urology for possible stent removal in the coming days.  He will follow-up with [place] as originally scheduled. The patient voiced understanding and agreement to the plan.  I spent 21 minutes with the patient discussing the above plan in addition to reviewing his chart and recent hospitalization on the same day of the visit.  Jilda Roche Muldrow,  DO 11/02/22  4:37 PM

## 2022-11-02 NOTE — Patient Instructions (Signed)
Your blood work looked good.  Finish the [employer].  You are progressing well.   Let us know if you need anything.

## 2022-11-11 ENCOUNTER — Other Ambulatory Visit: Payer: Self-pay | Admitting: Urology

## 2022-11-11 DIAGNOSIS — R8271 Bacteriuria: Secondary | ICD-10-CM | POA: Diagnosis not present

## 2022-11-11 DIAGNOSIS — N132 Hydronephrosis with renal and ureteral calculous obstruction: Secondary | ICD-10-CM | POA: Diagnosis not present

## 2022-11-23 ENCOUNTER — Encounter: Payer: Self-pay | Admitting: Family Medicine

## 2022-11-24 ENCOUNTER — Encounter (HOSPITAL_BASED_OUTPATIENT_CLINIC_OR_DEPARTMENT_OTHER): Payer: Self-pay | Admitting: Urology

## 2022-11-24 NOTE — Progress Notes (Signed)
Spoke w/ via phone for pre-op interview--- pt Lab needs dos----    no     Lab results------ no COVID test -----patient states asymptomatic no test needed Arrive at -------  0700 on 12-03-2022 NPO after MN NO Solid Food.  Clear liquids from MN until--- 0600 Med rec completed Medications to take morning of surgery ----- none Diabetic medication ----- n/a Patient instructed no nail polish to be worn day of surgery Patient instructed to bring photo id and insurance card day of surgery Patient aware to have Driver (ride ) / caregiver  for 24 hours after surgery - -- wife, lauren Patient Special Instructions ----- n/a Pre-Op special Instructions ----- n/a Patient verbalized understanding of instructions that were given at this phone interview. Patient denies chest pain, sob, fever, cough at the interview.

## 2022-12-03 ENCOUNTER — Encounter (HOSPITAL_BASED_OUTPATIENT_CLINIC_OR_DEPARTMENT_OTHER)
Admission: RE | Disposition: A | Discharge status: Home / Self Care | Payer: Self-pay | Source: Home / Self Care | Attending: Urology

## 2022-12-03 ENCOUNTER — Encounter (HOSPITAL_BASED_OUTPATIENT_CLINIC_OR_DEPARTMENT_OTHER): Payer: Self-pay | Admitting: Urology

## 2022-12-03 ENCOUNTER — Ambulatory Visit (HOSPITAL_BASED_OUTPATIENT_CLINIC_OR_DEPARTMENT_OTHER)
Admission: RE | Admit: 2022-12-03 | Discharge: 2022-12-03 | Disposition: A | Discharge status: Home / Self Care | Payer: BC Managed Care – PPO | Attending: Urology | Admitting: Urology

## 2022-12-03 ENCOUNTER — Ambulatory Visit (HOSPITAL_BASED_OUTPATIENT_CLINIC_OR_DEPARTMENT_OTHER): Payer: BC Managed Care – PPO | Admitting: Anesthesiology

## 2022-12-03 ENCOUNTER — Other Ambulatory Visit: Payer: Self-pay

## 2022-12-03 DIAGNOSIS — E669 Obesity, unspecified: Secondary | ICD-10-CM | POA: Diagnosis not present

## 2022-12-03 DIAGNOSIS — Z6833 Body mass index (BMI) 33.0-33.9, adult: Secondary | ICD-10-CM | POA: Diagnosis not present

## 2022-12-03 DIAGNOSIS — G4733 Obstructive sleep apnea (adult) (pediatric): Secondary | ICD-10-CM | POA: Diagnosis not present

## 2022-12-03 DIAGNOSIS — Q600 Renal agenesis, unilateral: Secondary | ICD-10-CM | POA: Diagnosis not present

## 2022-12-03 DIAGNOSIS — N201 Calculus of ureter: Secondary | ICD-10-CM | POA: Insufficient documentation

## 2022-12-03 DIAGNOSIS — Z01818 Encounter for other preprocedural examination: Secondary | ICD-10-CM

## 2022-12-03 HISTORY — DX: Personal history of other diseases of the digestive system: Z87.19

## 2022-12-03 HISTORY — DX: Obstructive sleep apnea (adult) (pediatric): G47.33

## 2022-12-03 HISTORY — DX: Unspecified osteoarthritis, unspecified site: M19.90

## 2022-12-03 HISTORY — DX: Calculus of ureter: N20.1

## 2022-12-03 HISTORY — DX: Personal history of other specified conditions: Z87.898

## 2022-12-03 HISTORY — DX: Renal agenesis, unilateral: Q60.0

## 2022-12-03 HISTORY — PX: CYSTOSCOPY/URETEROSCOPY/HOLMIUM LASER/STENT PLACEMENT: SHX6546

## 2022-12-03 LAB — POCT I-STAT, CHEM 8
BUN: 16 mg/dL (ref 6–20)
Calcium, Ion: 1.27 mmol/L (ref 1.15–1.40)
Chloride: 106 mmol/L (ref 98–111)
Creatinine, Ser: 1.2 mg/dL (ref 0.61–1.24)
Glucose, Bld: 102 mg/dL — ABNORMAL HIGH (ref 70–99)
HCT: 44 % (ref 39.0–52.0)
Hemoglobin: 15 g/dL (ref 13.0–17.0)
Potassium: 4.1 mmol/L (ref 3.5–5.1)
Sodium: 143 mmol/L (ref 135–145)
TCO2: 25 mmol/L (ref 22–32)

## 2022-12-03 SURGERY — CYSTOSCOPY/URETEROSCOPY/HOLMIUM LASER/STENT PLACEMENT
Anesthesia: General | Site: Renal | Laterality: Right

## 2022-12-03 MED ORDER — FENTANYL CITRATE (PF) 100 MCG/2ML IJ SOLN
INTRAMUSCULAR | Status: AC
  Filled 2022-12-03: qty 2

## 2022-12-03 MED ORDER — HYOSCYAMINE SULFATE 0.125 MG PO TABS: 0.1250 mg | ORAL_TABLET | Freq: Four times a day (QID) | ORAL | 0 refills | Status: DC | PRN

## 2022-12-03 MED ORDER — OXYCODONE HCL 5 MG PO TABS
ORAL_TABLET | ORAL | Status: AC
  Filled 2022-12-03: qty 1

## 2022-12-03 MED ORDER — SODIUM CHLORIDE 0.9 % IR SOLN
Status: DC | PRN
  Administered 2022-12-03: 3000 mL

## 2022-12-03 MED ORDER — PROPOFOL 10 MG/ML IV BOLUS
INTRAVENOUS | Status: AC
  Filled 2022-12-03: qty 20

## 2022-12-03 MED ORDER — SODIUM CHLORIDE 0.9 % IV SOLN: INTRAVENOUS | Status: DC

## 2022-12-03 MED ORDER — PROPOFOL 10 MG/ML IV BOLUS
INTRAVENOUS | Status: DC | PRN
  Administered 2022-12-03 (×2): 20 mg via INTRAVENOUS
  Administered 2022-12-03: 200 mg via INTRAVENOUS

## 2022-12-03 MED ORDER — OXYCODONE HCL 5 MG PO TABS
5.0000 mg | ORAL_TABLET | Freq: Once | ORAL | Status: AC | PRN
  Administered 2022-12-03: 5 mg via ORAL

## 2022-12-03 MED ORDER — KETOROLAC TROMETHAMINE 30 MG/ML IJ SOLN
30.0000 mg | Freq: Once | INTRAMUSCULAR | Status: AC | PRN
  Administered 2022-12-03: 30 mg via INTRAVENOUS

## 2022-12-03 MED ORDER — 0.9 % SODIUM CHLORIDE (POUR BTL) OPTIME
TOPICAL | Status: DC | PRN
Start: 2022-12-03 — End: 2022-12-03
  Administered 2022-12-03: 500 mL

## 2022-12-03 MED ORDER — LACTATED RINGERS IV SOLN: INTRAVENOUS | Status: DC

## 2022-12-03 MED ORDER — STERILE WATER FOR IRRIGATION IR SOLN
Status: DC | PRN
Start: 2022-12-03 — End: 2022-12-03
  Administered 2022-12-03: 500 mL

## 2022-12-03 MED ORDER — DEXAMETHASONE SODIUM PHOSPHATE 10 MG/ML IJ SOLN
INTRAMUSCULAR | Status: AC
  Filled 2022-12-03: qty 1

## 2022-12-03 MED ORDER — DEXAMETHASONE SODIUM PHOSPHATE 10 MG/ML IJ SOLN
INTRAMUSCULAR | Status: DC | PRN
  Administered 2022-12-03: 10 mg via INTRAVENOUS

## 2022-12-03 MED ORDER — KETOROLAC TROMETHAMINE 30 MG/ML IJ SOLN
INTRAMUSCULAR | Status: AC
  Filled 2022-12-03: qty 1

## 2022-12-03 MED ORDER — CEFAZOLIN SODIUM-DEXTROSE 2-4 GM/100ML-% IV SOLN
2.0000 g | INTRAVENOUS | Status: AC
  Administered 2022-12-03: 2 g via INTRAVENOUS

## 2022-12-03 MED ORDER — ONDANSETRON HCL 4 MG/2ML IJ SOLN: 4.0000 mg | Freq: Once | INTRAMUSCULAR | Status: DC | PRN

## 2022-12-03 MED ORDER — LIDOCAINE 2% (20 MG/ML) 5 ML SYRINGE
INTRAMUSCULAR | Status: DC | PRN
  Administered 2022-12-03: 100 mg via INTRAVENOUS

## 2022-12-03 MED ORDER — FENTANYL CITRATE (PF) 250 MCG/5ML IJ SOLN
INTRAMUSCULAR | Status: DC | PRN
  Administered 2022-12-03 (×2): 25 ug via INTRAVENOUS
  Administered 2022-12-03: 50 ug via INTRAVENOUS

## 2022-12-03 MED ORDER — LIDOCAINE HCL (PF) 2 % IJ SOLN
INTRAMUSCULAR | Status: AC
  Filled 2022-12-03: qty 5

## 2022-12-03 MED ORDER — OXYCODONE HCL 5 MG/5ML PO SOLN: 5.0000 mg | Freq: Once | ORAL | Status: AC | PRN

## 2022-12-03 MED ORDER — CEFAZOLIN SODIUM-DEXTROSE 2-4 GM/100ML-% IV SOLN
INTRAVENOUS | Status: AC
  Filled 2022-12-03: qty 100

## 2022-12-03 MED ORDER — FENTANYL CITRATE (PF) 100 MCG/2ML IJ SOLN: 25.0000 ug | INTRAMUSCULAR | Status: DC | PRN

## 2022-12-03 MED ORDER — LACTATED RINGERS IV SOLN: INTRAVENOUS | Status: DC | PRN

## 2022-12-03 MED ORDER — DEXMEDETOMIDINE HCL IN NACL 80 MCG/20ML IV SOLN
INTRAVENOUS | Status: DC | PRN
Start: 2022-12-03 — End: 2022-12-03
  Administered 2022-12-03 (×3): 8 ug via INTRAVENOUS

## 2022-12-03 MED ORDER — ONDANSETRON HCL 4 MG/2ML IJ SOLN
INTRAMUSCULAR | Status: DC | PRN
  Administered 2022-12-03: 4 mg via INTRAVENOUS

## 2022-12-03 MED ORDER — ONDANSETRON HCL 4 MG/2ML IJ SOLN
INTRAMUSCULAR | Status: AC
  Filled 2022-12-03: qty 2

## 2022-12-03 MED ORDER — MIDAZOLAM HCL 2 MG/2ML IJ SOLN
INTRAMUSCULAR | Status: DC | PRN
  Administered 2022-12-03: 2 mg via INTRAVENOUS

## 2022-12-03 MED ORDER — DEXMEDETOMIDINE HCL IN NACL 80 MCG/20ML IV SOLN
INTRAVENOUS | Status: AC
  Filled 2022-12-03: qty 20

## 2022-12-03 MED ORDER — MIDAZOLAM HCL 2 MG/2ML IJ SOLN
INTRAMUSCULAR | Status: AC
  Filled 2022-12-03: qty 2

## 2022-12-03 SURGICAL SUPPLY — 30 items
APL SKNCLS STERI-STRIP NONHPOA (GAUZE/BANDAGES/DRESSINGS) ×1
BAG DRAIN URO-CYSTO SKYTR STRL (DRAIN) ×2 IMPLANT
BAG DRN UROCATH (DRAIN) ×1
BASKET STONE 1.7 NGAGE (UROLOGICAL SUPPLIES) IMPLANT
BASKET ZERO TIP NITINOL 2.4FR (BASKET) ×2 IMPLANT
BENZOIN TINCTURE PRP APPL 2/3 (GAUZE/BANDAGES/DRESSINGS) IMPLANT
BSKT STON RTRVL ZERO TP 2.4FR (BASKET) ×1
CATH URETERAL DUAL LUMEN 10F (MISCELLANEOUS) IMPLANT
CATH URETL OPEN 5X70 (CATHETERS) IMPLANT
CLOTH BEACON ORANGE TIMEOUT ST (SAFETY) ×2 IMPLANT
DRSG TEGADERM 2-3/8X2-3/4 SM (GAUZE/BANDAGES/DRESSINGS) IMPLANT
GLOVE BIO SURGEON STRL SZ8 (GLOVE) IMPLANT
GOWN STRL REUS W/TWL XL LVL3 (GOWN DISPOSABLE) ×2 IMPLANT
GUIDEWIRE STR DUAL SENSOR (WIRE) IMPLANT
GUIDEWIRE ZIPWRE .038 STRAIGHT (WIRE) IMPLANT
IV NS IRRIG 3000ML ARTHROMATIC (IV SOLUTION) ×4 IMPLANT
KIT TURNOVER CYSTO (KITS) ×2 IMPLANT
LASER FIB FLEXIVA PULSE ID 365 (Laser) IMPLANT
MANIFOLD NEPTUNE II (INSTRUMENTS) ×2 IMPLANT
NS IRRIG 500ML POUR BTL (IV SOLUTION) ×2 IMPLANT
PACK CYSTO (CUSTOM PROCEDURE TRAY) ×2 IMPLANT
SLEEVE SCD COMPRESS KNEE MED (STOCKING) ×2 IMPLANT
STENT URET 6FRX24 CONTOUR (STENTS) IMPLANT
STRIP CLOSURE SKIN 1/2X4 (GAUZE/BANDAGES/DRESSINGS) IMPLANT
SYR 10ML LL (SYRINGE) IMPLANT
TRACTIP FLEXIVA PULS ID 200XHI (Laser) IMPLANT
TRACTIP FLEXIVA PULSE ID 200 (Laser) ×1
TUBE CONNECTING 12X1/4 (SUCTIONS) IMPLANT
TUBING UROLOGY SET (TUBING) ×2 IMPLANT
WATER STERILE IRR 500ML POUR (IV SOLUTION) IMPLANT

## 2022-12-03 NOTE — H&P (Signed)
CC/HPI: cc: Right ureteral stone, post stent placement in the setting of solitary kidney   HPI: 37 year old man with a solitary right kidney s/p stent with Dr. Sherron Monday on 10/22/22. presents today for Ureteroscopy. He is tolerating the stent well denies fevers and chills. He has not passed any stone fragments. He has 2 right ureteral stones that were not well-visualized on KUB previously.   PT has a 6x24 JJ stent in place.     ALLERGIES: Codeine Morphine Sulfate    MEDICATIONS: Flomax 0.4 mg capsule 1 capsule PO Q HS  Flonase Allergy Relief     GU PSH: No GU PSH      PSH Notes: small intestinal surgery, colon surgery   NON-GU PSH: No Non-GU PSH    GU PMH: None   NON-GU PMH: Anxiety Arthritis Asthma Sleep Apnea    FAMILY HISTORY: No Family History    SOCIAL HISTORY: Marital Status: Married Preferred Language: English; Ethnicity: Not Hispanic Or Latino; Race: White Current Smoking Status: Patient has never smoked.   Tobacco Use Assessment Completed: Used Tobacco in last 30 days? Light Drinker.  Drinks 1 caffeinated drink per day.    REVIEW OF SYSTEMS:    GU Review Male:   Patient reports frequent urination and hard to postpone urination. Patient denies burning/ pain with urination, get up at night to urinate, leakage of urine, stream starts and stops, trouble starting your stream, have to strain to urinate , erection problems, and penile pain.  Gastrointestinal (Upper):   Patient denies indigestion/ heartburn, vomiting, and nausea.  Gastrointestinal (Lower):   Patient denies diarrhea and constipation.  Constitutional:   Patient denies fever, night sweats, weight loss, and fatigue.  Skin:   Patient denies skin rash/ lesion and itching.  Eyes:   Patient denies blurred vision and double vision.  Ears/ Nose/ Throat:   Patient denies sore throat and sinus problems.  Hematologic/Lymphatic:   Patient denies swollen glands and easy bruising.  Cardiovascular:   Patient denies  leg swelling and chest pains.  Respiratory:   Patient denies cough and shortness of breath.  Endocrine:   Patient denies excessive thirst.  Musculoskeletal:   Patient denies back pain and joint pain.  Neurological:   Patient denies headaches and dizziness.  Psychologic:   Patient denies depression and anxiety.   VITAL SIGNS:      11/11/2022 08:30 AM  Weight 198 lb / 89.81 kg  Height 65 in / 165.1 cm  BP 120/84 mmHg  Pulse 80 /min  BMI 32.9 kg/m   MULTI-SYSTEM PHYSICAL EXAMINATION:    Constitutional: Well-nourished. No physical deformities. Normally developed. Good grooming.  Respiratory: No labored breathing, no use of accessory muscles.   Cardiovascular: Normal temperature, normal extremity pulses, no swelling, no varicosities.  Skin: No paleness, no jaundice, no cyanosis. No lesion, no ulcer, no rash.  Neurologic / Psychiatric: Oriented to time, oriented to place, oriented to person. No depression, no anxiety, no agitation.  Gastrointestinal: No mass, no tenderness, no rigidity, non obese abdomen.     Complexity of Data:  Source Of History:  Patient  Records Review:   Previous Hospital Records  Urine Test Review:   Urinalysis  X-Ray Review: KUB: Reviewed Films. Reviewed Report. Discussed With Patient.     11/11/22  Urinalysis  Urine Appearance Cloudy   Urine Color Amber   Urine Glucose Neg mg/dL  Urine Bilirubin Neg mg/dL  Urine Ketones Neg mg/dL  Urine Specific Gravity 1.020   Urine Blood 3+ ery/uL  Urine  pH 6.0   Urine Protein 1+ mg/dL  Urine Urobilinogen 0.2 mg/dL  Urine Nitrites Neg   Urine Leukocyte Esterase 2+ leu/uL  Urine WBC/hpf 0 - 5/hpf   Urine RBC/hpf >60/hpf   Urine Epithelial Cells NS (Not Seen)   Urine Bacteria Few (10-25/hpf)   Urine Mucous Not Present   Urine Yeast NS (Not Seen)   Urine Trichomonas Not Present   Urine Cystals NS (Not Seen)   Urine Casts NS (Not Seen)   Urine Sperm Not Present    PROCEDURES:         KUB - 78469  A single  view of the abdomen is obtained.  Ureteral Stent:  In position ureteral stent. Right ureteral stent.      Patient confirmed No Neulasta OnPro Device.           Urinalysis w/Scope Dipstick Dipstick Cont'd Micro  Color: Amber Bilirubin: Neg mg/dL WBC/hpf: 0 - 5/hpf  Appearance: Cloudy Ketones: Neg mg/dL RBC/hpf: >62/XBM  Specific Gravity: 1.020 Blood: 3+ ery/uL Bacteria: Few (10-25/hpf)  pH: 6.0 Protein: 1+ mg/dL Cystals: NS (Not Seen)  Glucose: Neg mg/dL Urobilinogen: 0.2 mg/dL Casts: NS (Not Seen)    Nitrites: Neg Trichomonas: Not Present    Leukocyte Esterase: 2+ leu/uL Mucous: Not Present      Epithelial Cells: NS (Not Seen)      Yeast: NS (Not Seen)      Sperm: Not Present         Ketoralac 60mg  - [id number], [id number] 60 mg was given and zero was wasted.   Qty: 60 Adm. By: Andree Moro  Unit: mg Lot No [id number]  Route: IM Exp. Date 11/17/2023  Freq: None Mfgr.:   Site: Right Hip   ASSESSMENT:      ICD-10 Details  1 GU:   Ureteral obstruction secondary to calculous - N13.2 Right, Acute, Uncomplicated   PLAN:           Orders Labs CULTURE, URINE  X-Rays: KUB          Schedule Procedure: 11/11/2022 at [facility] - [phone] - Ketoralac 60mg  (Toradol per 15mg  (60 mg)) - [id number], [id number]          Document Letter(s):  Created for Chart: Work Excuse   Created for Patient: Clinical Summary         Notes:   Stent was not removed today. KUB shows right ureteral stent in position and I am unable to appreciate any opacities along the ureter. He will need definitive stone intervention by way of ureteroscopy. Urinalysis will be sent for precautionary culture today. Stone intervention was discussed in detail today. For ureteroscopy, the patient understands that there is a chance for a staged procedure. Patient also understands that there is risk for bleeding, infection, injury to surrounding organs, and general risks of anesthesia. The patient also understands the  placement of a stent and the risks of stent placement including, risk for infection, the risk for pain, and the risk for injury. For ESWL, the patient understands that there is a chance of failure of procedure, there is also a risk for bruising, infection, bleeding, and injury to surrounding structures. The patient verbalized understanding to these risks.    Patient to OR today for Right ureteroscopy with laser lithotripsy and stent placement.  Right arm was marked

## 2022-12-03 NOTE — Anesthesia Procedure Notes (Signed)
Procedure Name: LMA Insertion Date/Time: 12/03/2022 8:00 AM  Performed by: Dairl Ponder, CRNAPre-anesthesia Checklist: Patient identified, Emergency Drugs available, Suction available and Patient being monitored Patient Re-evaluated:Patient Re-evaluated prior to induction Oxygen Delivery Method: Circle System Utilized Preoxygenation: Pre-oxygenation with 100% oxygen Induction Type: IV induction Ventilation: Mask ventilation without difficulty LMA: LMA inserted LMA Size: 4.0 Number of attempts: 1 Airway Equipment and Method: Bite block Placement Confirmation: positive ETCO2 Tube secured with: Tape Dental Injury: Teeth and Oropharynx as per pre-operative assessment

## 2022-12-03 NOTE — Transfer of Care (Signed)
Immediate Anesthesia Transfer of Care Note  Patient: Duane Oneal  Procedure(s) Performed: CYSTOSCOPY, RIGHT URETEROSCOPY, HOLMIUM LASER LITHOTRIPSY, RIGHT URETERAL STENT EXCHANGE (Right: Renal)  Patient Location: PACU  Anesthesia Type:General  Level of Consciousness: sedated  Airway & Oxygen Therapy: Patient Spontanous Breathing and Patient connected to face mask oxygen  Post-op Assessment: Report given to RN and Post -op Vital signs reviewed and stable  Post vital signs: Reviewed and stable  Last Vitals:  Vitals Value Taken Time  BP 98/52 12/03/22 0907  Temp    Pulse 71 12/03/22 0909  Resp 17 12/03/22 0909  SpO2 92 % 12/03/22 0909  Vitals shown include unfiled device data.  Last Pain:  Vitals:   12/03/22 0714  TempSrc: Oral  PainSc: 1          Complications: No notable events documented.

## 2022-12-03 NOTE — Anesthesia Postprocedure Evaluation (Signed)
Anesthesia Post Note  Patient: Blayne Frankie  Procedure(s) Performed: CYSTOSCOPY, RIGHT URETEROSCOPY, HOLMIUM LASER LITHOTRIPSY, RIGHT URETERAL STENT EXCHANGE (Right: Renal)     Patient location during evaluation: PACU Anesthesia Type: General Level of consciousness: awake and alert Pain management: pain level controlled Vital Signs Assessment: post-procedure vital signs reviewed and stable Respiratory status: spontaneous breathing, nonlabored ventilation, respiratory function stable and patient connected to nasal cannula oxygen Cardiovascular status: blood pressure returned to baseline and stable Postop Assessment: no apparent nausea or vomiting Anesthetic complications: no  No notable events documented.  Last Vitals:  Vitals:   12/03/22 0908 12/03/22 0909  BP: (!) 98/52   Pulse:  73  Resp:  20  Temp:  36.4 C  SpO2:  92%    Last Pain:  Vitals:   12/03/22 0909  TempSrc:   PainSc: Asleep                 Ra Pfiester S

## 2022-12-03 NOTE — Op Note (Signed)
Preoperative diagnosis: right ureteral calculus  Postoperative diagnosis: right ureteral calculus  Procedure:  Cystoscopy right ureteroscopy and stone removal Ureteroscopic laser lithotripsy right 61F x 24cm ureteral stent placement   Surgeon: Dr. Vilma Prader  Anesthesia: General  Complications: None  Intraoperative findings:  - 2 stone noted within the ureter both fragmented with the laser and basketed out - no bladder abnormalities - 6x24 stent placement confirmed with flouro.    EBL: Minimal  Specimens: right ureteral calculus  Disposition of specimens: [facility] for stone analysis  Indication: Duane Oneal is a 37 y.o.   patient with solitary right kidney a 5mm right ureteral stone s/p right ureteral stent on 9/5.Marland Kitchen After reviewing the management options for treatment, the patient elected to proceed with the above surgical procedure(s). We have discussed the potential benefits and risks of the procedure, side effects of the proposed treatment, the likelihood of the patient achieving the goals of the procedure, and any potential problems that might occur during the procedure or recuperation. Informed consent has been obtained.   Description of procedure:  The patient was taken to the operating room and general anesthesia was induced.  The patient was placed in the dorsal lithotomy position, prepped and draped in the usual sterile fashion, and preoperative antibiotics were administered. A preoperative time-out was performed.   Cystourethroscopy was performed.  The patient's urethra was examined and was normal.  Pan cystoscopy was performed and the bladder systematically examined in its entirety. There was no evidence for any bladder tumors, stones, or other mucosal pathology.    Attention then turned to the right ureteral orifice. A 0.38 sensor guidewire was then advanced up the right ureter into the renal pelvis under fluoroscopic guidance. The 6 Fr  semirigid ureteroscope was then advanced into the ureter next to the guidewire and the calculus was identified.   The stone was then fragmented with the 200 micron holmium laser fiber on a setting of 0.5 and frequency of 5 Hz.   All stones were then removed from the ureter with an N-gage nitinol basket.  Reinspection of the ureter revealed no remaining visible stones or fragments.   The wire was then backloaded through the cystoscope and a ureteral stent was advance over the wire using Seldinger technique.  The stent was positioned appropriately under fluoroscopic and cystoscopic guidance.  The wire was then removed with an adequate stent curl noted in the renal pelvis as well as in the bladder.  The bladder was then emptied and the procedure ended.  The patient appeared to tolerate the procedure well and without complications.  The patient was able to be awakened and transferred to the recovery unit in satisfactory condition.   Disposition: The tether of the stent was left on secured to the dorsal aspect of the patient's penis. Instructions for removing the stent have been provided to the patient. The patient has been scheduled for followup in 6 weeks with a renal ultrasound. The patient will remove the stent on 12/07/22

## 2022-12-03 NOTE — Anesthesia Preprocedure Evaluation (Signed)
Anesthesia Evaluation  Patient identified by MRN, date of birth, ID band Patient awake    Reviewed: Allergy & Precautions, H&P , NPO status , Patient's Chart, lab work & pertinent test results  Airway Mallampati: II  TM Distance: >3 FB Neck ROM: Full    Dental no notable dental hx.    Pulmonary neg pulmonary ROS   Pulmonary exam normal breath sounds clear to auscultation       Cardiovascular negative cardio ROS Normal cardiovascular exam Rhythm:Regular Rate:Normal     Neuro/Psych negative neurological ROS  negative psych ROS   GI/Hepatic negative GI ROS, Neg liver ROS,,,  Endo/Other  obesity  Renal/GU negative Renal ROS  negative genitourinary   Musculoskeletal negative musculoskeletal ROS (+)    Abdominal   Peds negative pediatric ROS (+)  Hematology negative hematology ROS (+)   Anesthesia Other Findings   Reproductive/Obstetrics negative OB ROS                             Anesthesia Physical Anesthesia Plan  ASA: 2  Anesthesia Plan: General   Post-op Pain Management:    Induction: Intravenous  PONV Risk Score and Plan: 2 and Ondansetron, Dexamethasone and Treatment may vary due to age or medical condition  Airway Management Planned: LMA  Additional Equipment:   Intra-op Plan:   Post-operative Plan: Extubation in OR  Informed Consent: I have reviewed the patients History and Physical, chart, labs and discussed the procedure including the risks, benefits and alternatives for the proposed anesthesia with the patient or authorized representative who has indicated his/her understanding and acceptance.     Dental advisory given  Plan Discussed with: CRNA and Surgeon  Anesthesia Plan Comments:        Anesthesia Quick Evaluation

## 2022-12-03 NOTE — Discharge Instructions (Addendum)
DISCHARGE INSTRUCTIONS FOR KIDNEY STONE/URETERAL STENT   MEDICATIONS:  1.  Resume all your other meds from home - except do not take any extra narcotic pain meds that you may have at home.  2. Pyridium is to help with the burning/stinging when you urinate. 3. Hyoscyamine can be used for bladder spams 4. Please use the tamsulosin/flomax on the day before you remove your stent and for the next 2 days  5. You can use tylenol and ibuprofen for pain   ACTIVITY:  1. No strenuous activity x 1week  2. No driving while on narcotic pain medications  3. Drink plenty of water  4. Continue to walk at home - it is normal to see blood in the urine while the stent is in place, so keep active, but don't over do it.  5. May return to work/school tomorrow or when you feel ready  6. You may experience some pain when urinating in the kidney on the side that was operated on while the stent is in place this is normal 7. After your stent is removed it will be normal to experience some pain for the next 24 hours   BATHING:  1. You can shower and we recommend daily showers  2. You have a string coming from your urethra: The stent string is attached to your ureteral stent. Do not pull on this.   SIGNS/SYMPTOMS TO CALL:  Please call us if you have a fever greater than 101.5, uncontrolled nausea/vomiting, uncontrolled pain, dizziness, unable to urinate, bloody urine with clots greater than the size of a quarter, chest pain, shortness of breath, leg swelling, leg pain, redness around wound, drainage from wound, or any other concerns or questions.   You can reach [place] at [phone].   FOLLOW-UP:  1. You have an appointment in 6 weeks with a ultrasound of your kidneys prior.  You can remove your stent on 10/21 (Monday)  2. You have a string attached to your stent, you may remove it on 12/07/22.  To do this, stand in the shower (you will leak some urine when removing the stent), grabs the strings coming out of the  urethra and pull the strings in a consistent motion at medium speed until the stents are completely removed. You may feel an odd sensation in your back while this is occurring.           Post Anesthesia Home Care Instructions  Activity: Get plenty of rest for the remainder of the day. A responsible individual must stay with you for 24 hours following the procedure.  For the next 24 hours, DO NOT: -Drive a car -Advertising copywriter -Drink alcoholic beverages -Take any medication unless instructed by your physician -Make any legal decisions or sign important papers.  Meals: Start with liquid foods such as gelatin or soup. Progress to regular foods as tolerated. Avoid greasy, spicy, heavy foods. If nausea and/or vomiting occur, drink only clear liquids until the nausea and/or vomiting subsides. Call your physician if vomiting continues.  Special Instructions/Symptoms: Your throat may feel dry or sore from the anesthesia or the breathing tube placed in your throat during surgery. If this causes discomfort, gargle with warm salt water. The discomfort should disappear within 24 hours.  May take Advil, Motrin, or Ibuprofen starting at  5 PM today.

## 2022-12-07 ENCOUNTER — Encounter (HOSPITAL_BASED_OUTPATIENT_CLINIC_OR_DEPARTMENT_OTHER): Payer: Self-pay | Admitting: Urology

## 2022-12-07 NOTE — Plan of Care (Signed)
[facility] Tonsillectomy/Adenoidectomy, Postoperative PEDS care plan entered in error.

## 2022-12-11 DIAGNOSIS — N201 Calculus of ureter: Secondary | ICD-10-CM | POA: Diagnosis not present

## 2022-12-24 ENCOUNTER — Other Ambulatory Visit (HOSPITAL_BASED_OUTPATIENT_CLINIC_OR_DEPARTMENT_OTHER): Payer: Self-pay

## 2022-12-25 ENCOUNTER — Encounter: Payer: Self-pay | Admitting: Family Medicine

## 2022-12-25 ENCOUNTER — Ambulatory Visit: Payer: BC Managed Care – PPO | Admitting: Family Medicine

## 2022-12-25 VITALS — BP 128/78 | HR 79 | Temp 98.0°F | Resp 16 | Ht 65.0 in | Wt 201.0 lb

## 2022-12-25 DIAGNOSIS — R350 Frequency of micturition: Secondary | ICD-10-CM | POA: Diagnosis not present

## 2022-12-25 LAB — POC URINALSYSI DIPSTICK (AUTOMATED)
Bilirubin, UA: NEGATIVE
Blood, UA: NEGATIVE
Glucose, UA: NEGATIVE
Ketones, UA: NEGATIVE
Leukocytes, UA: NEGATIVE
Nitrite, UA: NEGATIVE
Protein, UA: NEGATIVE
Spec Grav, UA: 1.01
Urobilinogen, UA: 0.2 U/dL
pH, UA: 7

## 2022-12-25 NOTE — Patient Instructions (Signed)
Stay hydrated, but not sig more than 64 oz/day unless exercising.  Warning signs/symptoms: Uncontrollable nausea/vomiting, fevers, worsening symptoms despite treatment, confusion.  Give [place] around 2 business days to get culture back to you.  Let us know if you need anything.
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
## 2022-12-26 LAB — URINE CULTURE
MICRO NUMBER:: 15706090
Result:: NO GROWTH
SPECIMEN QUALITY:: ADEQUATE

## 2023-01-13 DIAGNOSIS — N201 Calculus of ureter: Secondary | ICD-10-CM | POA: Diagnosis not present

## 2023-01-29 DIAGNOSIS — N200 Calculus of kidney: Secondary | ICD-10-CM | POA: Diagnosis not present

## 2023-04-14 DIAGNOSIS — J0100 Acute maxillary sinusitis, unspecified: Secondary | ICD-10-CM | POA: Diagnosis not present

## 2023-04-24 IMAGING — DX DG ABD PORTABLE 1V
1 series · 1 of 1 positions shown · non-contrast
Comparison: Abdominal CT dated 10/07/2020.
COMPARISON: Abdominal CT dated 10/07/2020.

Addendum:
CLINICAL DATA: Status post NG placement.

EXAM:
PORTABLE ABDOMEN - 1 VIEW

[abdomen erect]
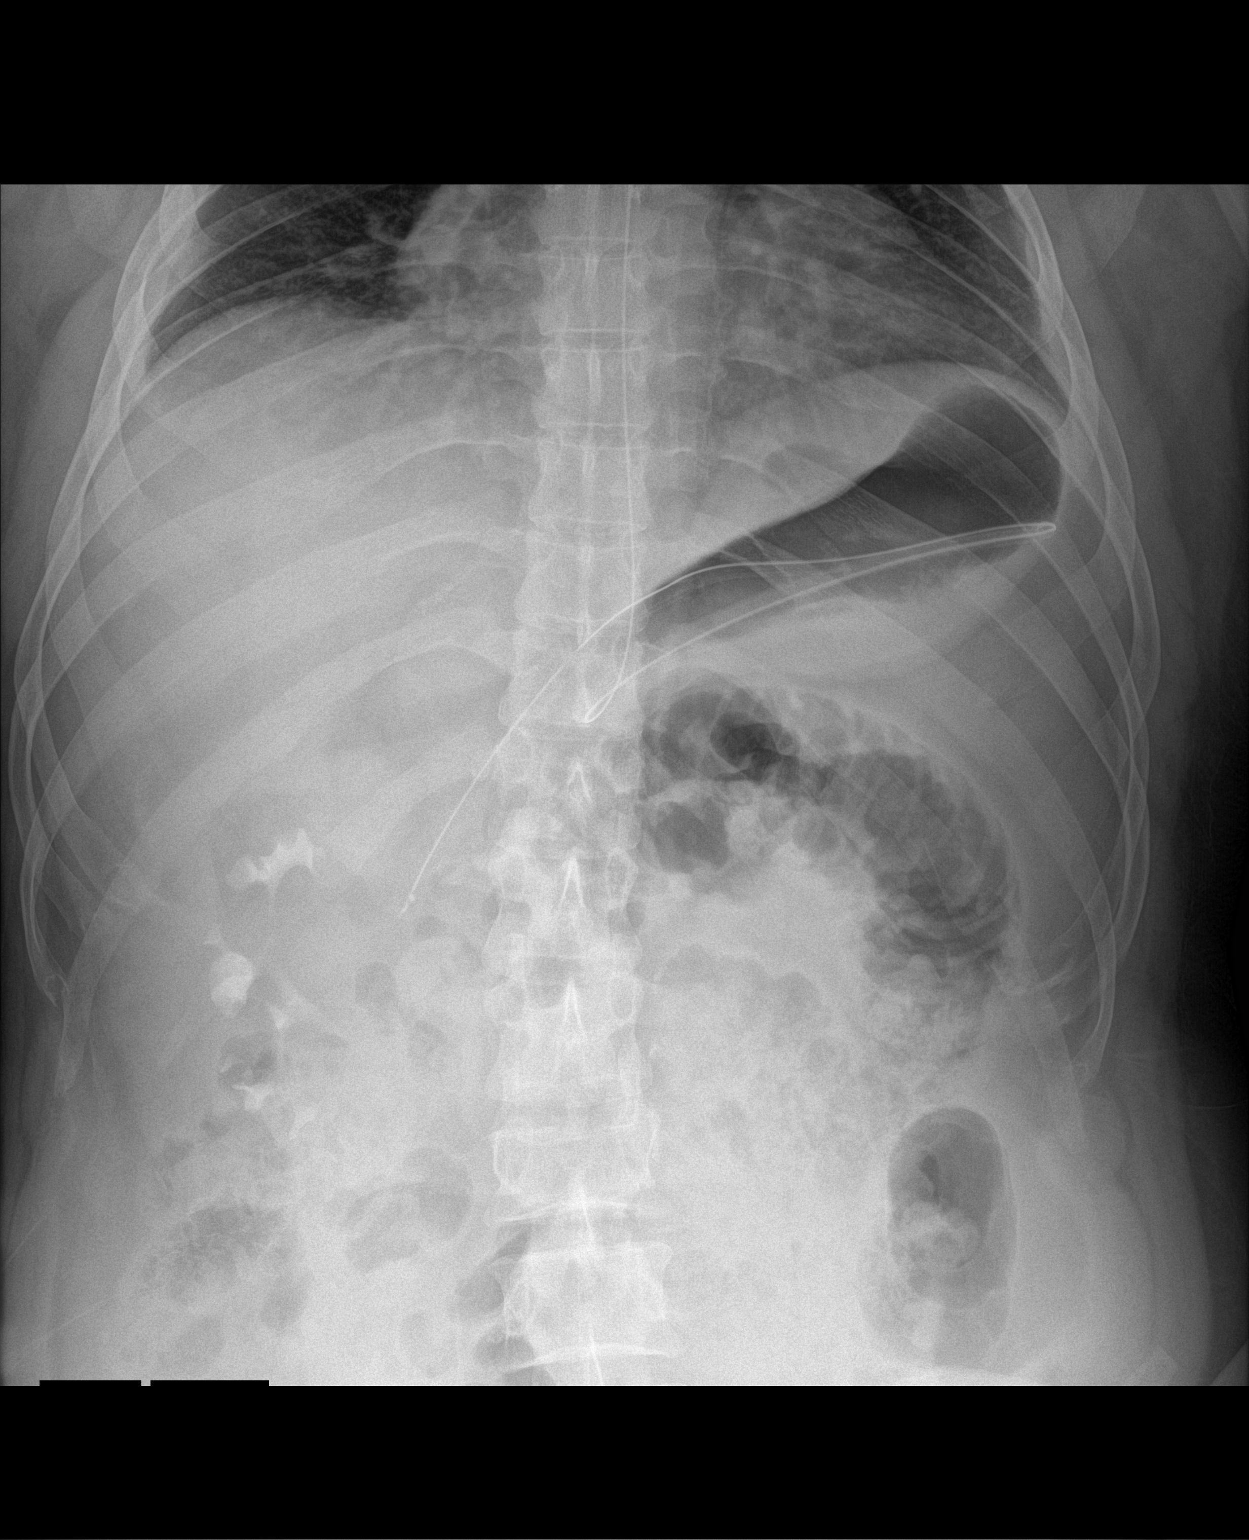

[1 of 1 positions shown; findings below may reference images not displayed]

FINDINGS: Enteric tube extend below the diaphragm with tip in the distal
stomach. There is a dilated small bowel loops the left upper abdomen
measuring 4.5 cm. Excreted contrast noted in the right renal
collecting system. No free air. The soft tissues are grossly
unremarkable. The osseous structures intact.
IMPRESSION: 1. Enteric tube with tip in the distal stomach.
2. Dilated small bowel in the left upper abdomen.

ADDENDUM:
Please note there is a focal area of apparent kinking of the tube
within the stomach which may be projectional. The tube can be pulled
back by approximately 12 cm to relieve the kink.

These results were called by telephone at the time of interpretation
on 10/07/2020 at [DATE] to provider APANISILE STEVO , who verbally
acknowledged these results.

*** End of Addendum ***
FINDINGS: Enteric tube extend below the diaphragm with tip in the distal
stomach. There is a dilated small bowel loops the left upper abdomen
measuring 4.5 cm. Excreted contrast noted in the right renal
collecting system. No free air. The soft tissues are grossly
unremarkable. The osseous structures intact.
IMPRESSION: 1. Enteric tube with tip in the distal stomach.
2. Dilated small bowel in the left upper abdomen.

## 2023-04-24 IMAGING — CT CT ABD-PELV W/ CM
2 of 6 series · 15 of 46 positions shown, 17 images · IV contrast (Omnipaque)
Comparison: 10/20/2011

CLINICAL DATA: Acute nonlocalized abdominal pain, nausea, vomiting

EXAM:
CT ABDOMEN AND PELVIS WITH CONTRAST
TECHNIQUE: Multidetector CT imaging of the abdomen and pelvis was performed
using the standard protocol following bolus administration of
intravenous contrast.
CONTRAST:  75mL OMNIPAQUE IOHEXOL 300 MG/ML  SOLN

[Series 2: axial st · axial · 0.92mm/px · z∈[+357,+817]mm · 12 of 104 slices shown, 14 images]
[im 6/104  soft-tissue]
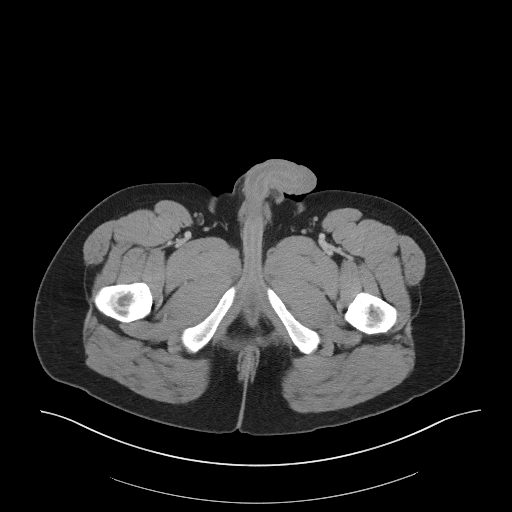
[im 6/104  bone]
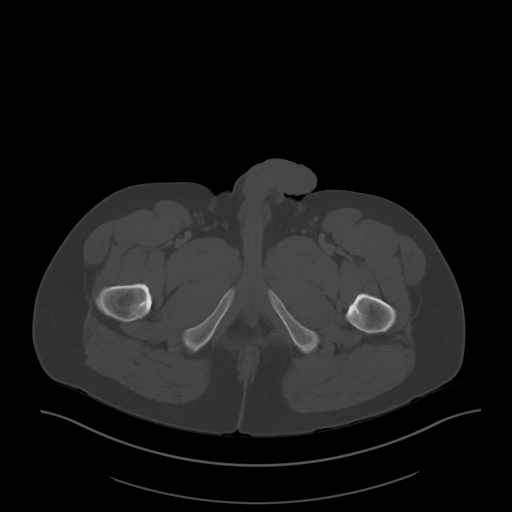
[im 18/104  soft-tissue]
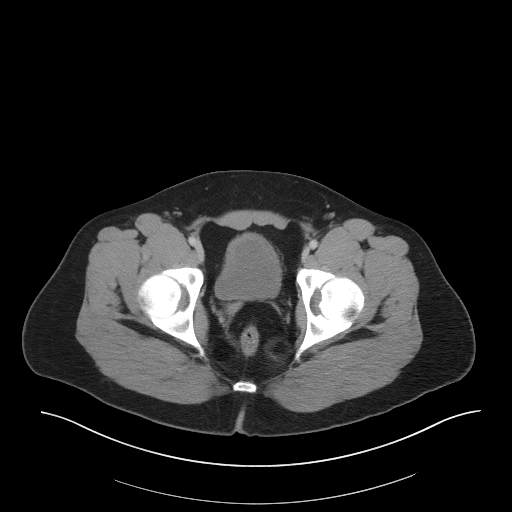
[im 23/104  soft-tissue]
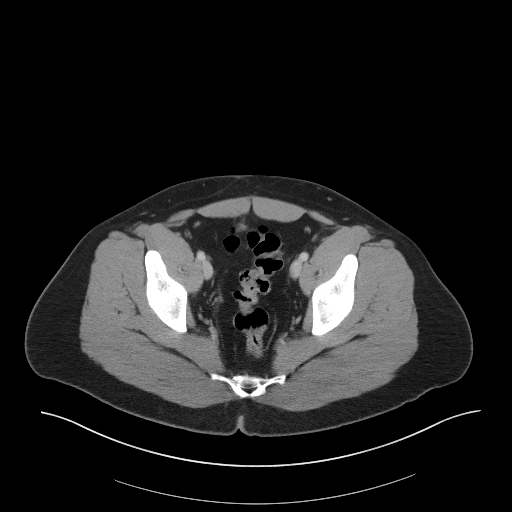
[im 29/104  soft-tissue]
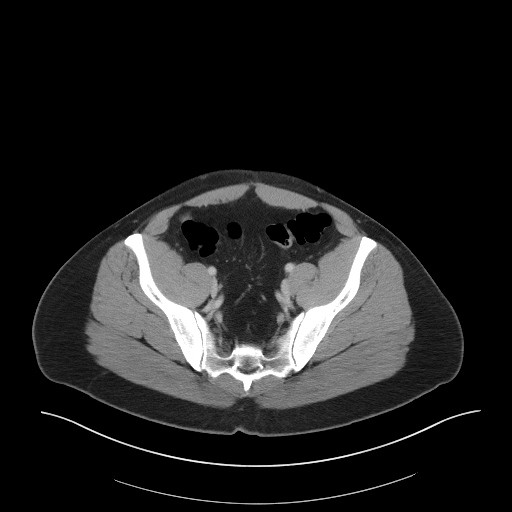
[im 41/104  soft-tissue]
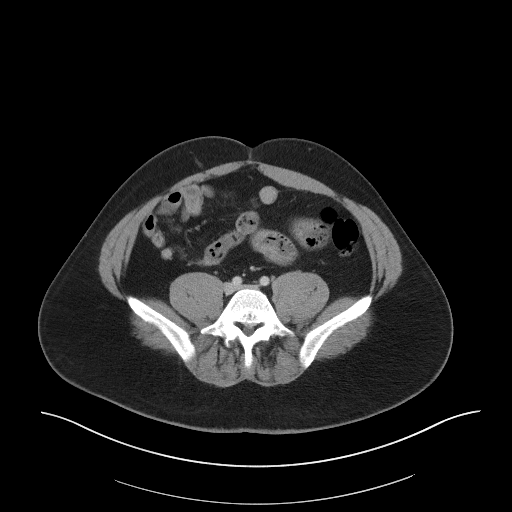
[im 46/104  soft-tissue]
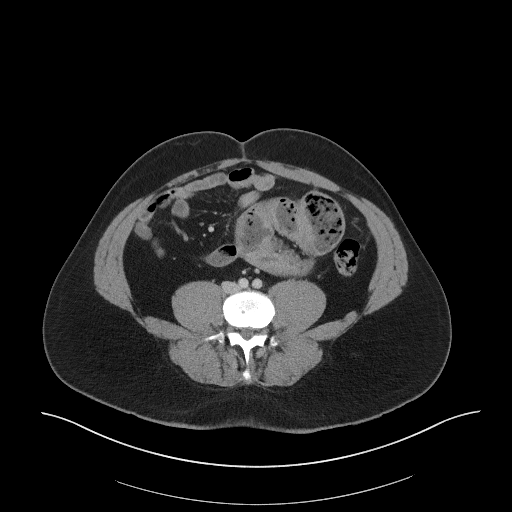
[im 58/104  soft-tissue]
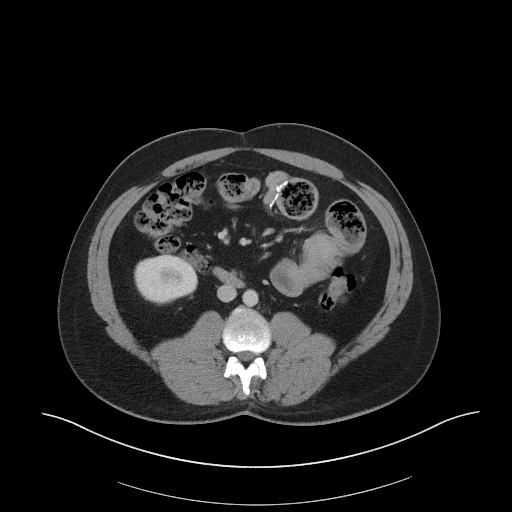
[im 63/104  soft-tissue]
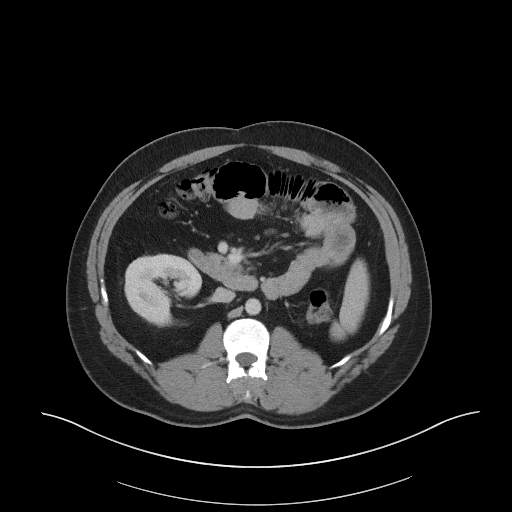
[im 75/104  soft-tissue]
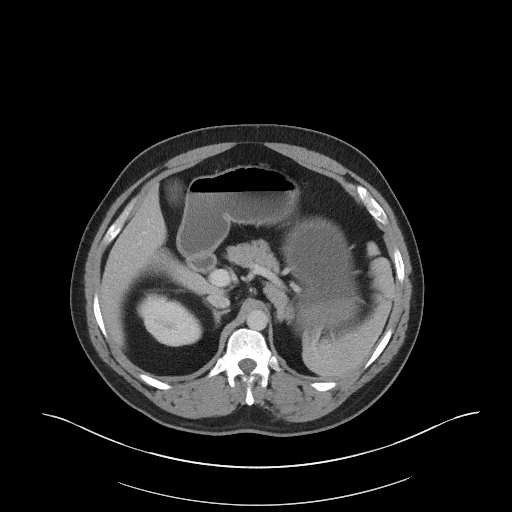
[im 75/104  bone]
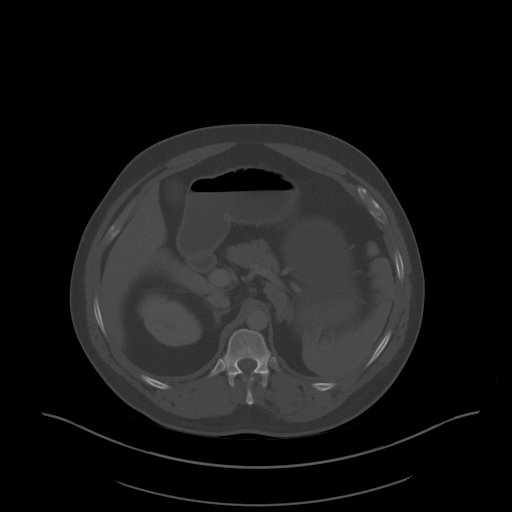
[im 81/104  soft-tissue]
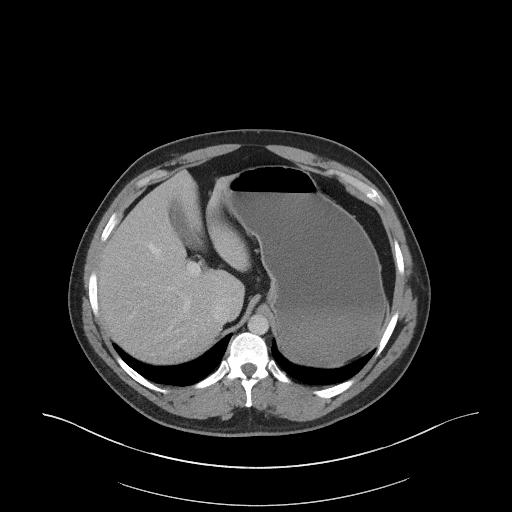
[im 86/104  soft-tissue]
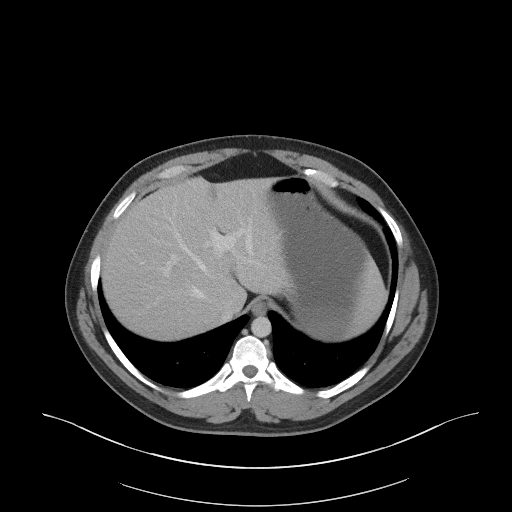
[im 98/104  soft-tissue]
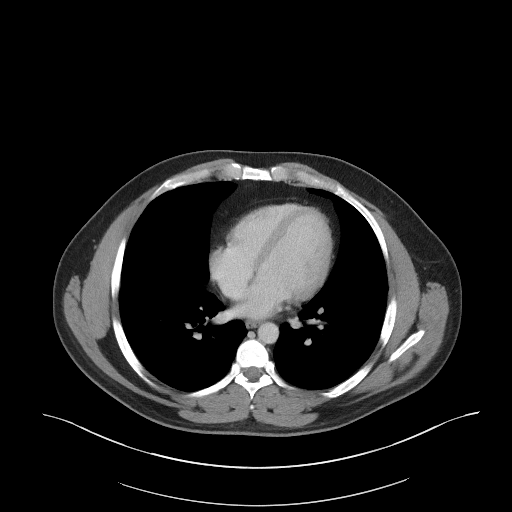

[Series 5: coronal st · coronal · 0.76mm/px · 3 of 105 slices shown]
[im 27/105  soft-tissue]
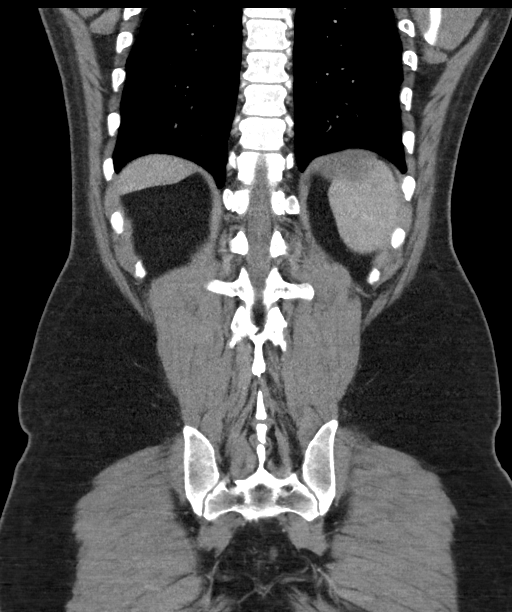
[im 53/105  soft-tissue]
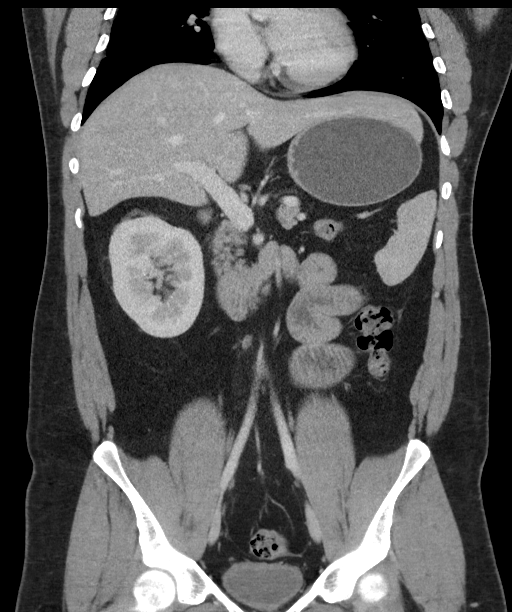
[im 79/105  soft-tissue]
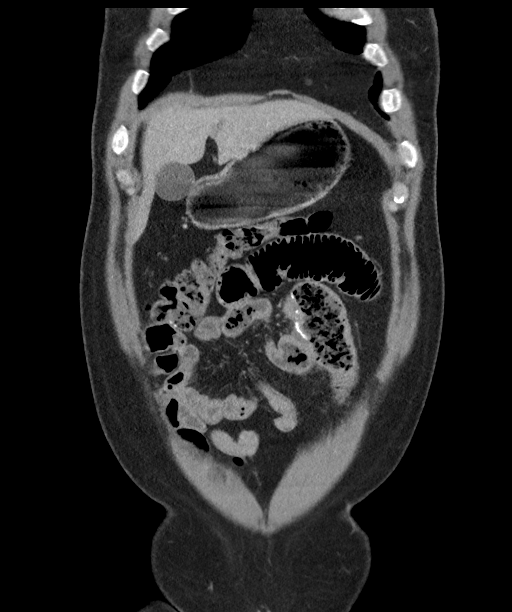

[15 of 46 positions shown; findings below may reference images not displayed]

FINDINGS: Lower chest: No acute abnormality.

Hepatobiliary: No focal liver abnormality is seen. No gallstones,
gallbladder wall thickening, or biliary dilatation.

Pancreas: Unremarkable

Spleen: Unremarkable

Adrenals/Urinary Tract: The adrenal glands are unremarkable. There
is congenital absence of the left kidney. The right kidney is
compensatory Jaiteh hypertrophied and is normal in position. At least 2
separate 2 mm nonobstructing calculi are seen within the interpolar
region of the right kidney. No ureteral calculi. No hydronephrosis.
No enhancing intrarenal masses. The bladder is unremarkable.

Stomach/Bowel: Surgical changes of a partial small bowel resection
and right hemicolectomy are identified. There is a high-grade
partial or developing complete small bowel obstruction at the small
bowel anastomosis within the a left mid abdomen anteriorly, best
seen on axial image # 50 and coronal image # 82. There is dilation
and extensive fecalized intraluminal contents within the small bowel
proximal to this point with decompression of the small bowel
distally in keeping with a obstructive process. There is small gas
and stool, however, seen throughout the colon suggesting an
incomplete obstruction. There is mild mesenteric edema involving the
dilated segment, best appreciated on coronal image # 75. Normal
bowel wall enhancement. The stomach is fluid-filled and distended.
No free intraperitoneal gas.

Vascular/Lymphatic: No significant vascular findings are present. No
enlarged abdominal or pelvic lymph nodes.

Reproductive: Prostate gland is unremarkable. There is hypoplasia of
the left seminal vesicle.

Other: No abdominal wall hernia.  Rectum is unremarkable.

Musculoskeletal: No acute bone abnormality. No lytic or blastic bone
lesion.
IMPRESSION: Status post partial small bowel resection and right hemicolectomy.
High-grade partial or developing complete small bowel obstruction
involving the small bowel anastomosis within the left mid abdomen.
Mild mesenteric edema adjacent to the dilated segment of bowel.
Fluid distension of the stomach noted and nasogastric decompression
may be helpful for further management.

Congenital absence of the left kidney.

Minimal nonobstructing right nephrolithiasis.

## 2023-06-18 ENCOUNTER — Other Ambulatory Visit (HOSPITAL_BASED_OUTPATIENT_CLINIC_OR_DEPARTMENT_OTHER): Payer: Self-pay

## 2023-07-02 ENCOUNTER — Encounter: Payer: Self-pay | Admitting: Family Medicine

## 2023-07-02 ENCOUNTER — Ambulatory Visit: Payer: Self-pay | Admitting: Family Medicine

## 2023-07-02 ENCOUNTER — Ambulatory Visit (INDEPENDENT_AMBULATORY_CARE_PROVIDER_SITE_OTHER): Admitting: Family Medicine

## 2023-07-02 VITALS — BP 108/64 | HR 74 | Temp 98.0°F | Resp 18 | Wt 204.8 lb

## 2023-07-02 DIAGNOSIS — Z0000 Encounter for general adult medical examination without abnormal findings: Secondary | ICD-10-CM | POA: Diagnosis not present

## 2023-07-02 LAB — LIPID PANEL
Cholesterol: 177 mg/dL (ref 0–200)
HDL: 39.8 mg/dL (ref >39.00)
LDL Cholesterol: 99 mg/dL (ref 0–99)
NonHDL: 137.44
Total CHOL/HDL Ratio: 4
Triglycerides: 192 mg/dL — ABNORMAL HIGH (ref 0.0–149.0)
VLDL: 38.4 mg/dL (ref 0.0–40.0)

## 2023-07-02 LAB — CBC
HCT: 44.5 % (ref 39.0–52.0)
Hemoglobin: 15.3 g/dL (ref 13.0–17.0)
MCHC: 34.4 g/dL (ref 30.0–36.0)
MCV: 90.6 fl (ref 78.0–100.0)
Platelets: 202 10*3/uL (ref 150.0–400.0)
RBC: 4.91 Mil/uL (ref 4.22–5.81)
RDW: 12.4 % (ref 11.5–15.5)
WBC: 6.4 10*3/uL (ref 4.0–10.5)

## 2023-07-02 LAB — COMPREHENSIVE METABOLIC PANEL WITH GFR
ALT: 39 U/L (ref 0–53)
AST: 20 U/L (ref 0–37)
Albumin: 4.5 g/dL (ref 3.5–5.2)
Alkaline Phosphatase: 60 U/L (ref 39–117)
BUN: 18 mg/dL (ref 6–23)
CO2: 27 meq/L (ref 19–32)
Calcium: 9.3 mg/dL (ref 8.4–10.5)
Chloride: 104 meq/L (ref 96–112)
Creatinine, Ser: 1.15 mg/dL (ref 0.40–1.50)
GFR: 81.59 mL/min (ref >60.00)
Glucose, Bld: 95 mg/dL (ref 70–99)
Potassium: 4.1 meq/L (ref 3.5–5.1)
Sodium: 139 meq/L (ref 135–145)
Total Bilirubin: 0.8 mg/dL (ref 0.2–1.2)
Total Protein: 6.8 g/dL (ref 6.0–8.3)
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
## 2023-07-02 NOTE — Patient Instructions (Signed)
 Give [place] 2-3 business days to get the results of your labs back.   Keep the diet clean and stay active.  Do monthly self testicular checks in the shower. You are feeling for lumps/bumps that don't belong. If you feel anything like this, let me know!  Please get me a copy of your advanced directive form at your convenience.   Let us know if you need anything.

## 2023-10-22 DIAGNOSIS — J069 Acute upper respiratory infection, unspecified: Secondary | ICD-10-CM | POA: Diagnosis not present

## 2023-10-27 ENCOUNTER — Encounter: Payer: Self-pay | Admitting: Family Medicine

## 2023-10-27 ENCOUNTER — Ambulatory Visit: Admitting: Family Medicine

## 2023-10-27 VITALS — BP 122/80 | HR 98 | Temp 98.0°F | Resp 16 | Ht 65.0 in | Wt 208.2 lb

## 2023-10-27 DIAGNOSIS — N509 Disorder of male genital organs, unspecified: Secondary | ICD-10-CM | POA: Diagnosis not present
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
## 2023-10-27 NOTE — Patient Instructions (Signed)
 Someone will be in touch with your ultrasound.   Let us  know if you need anything.

## 2023-11-18 DIAGNOSIS — R079 Chest pain, unspecified: Secondary | ICD-10-CM | POA: Diagnosis not present

## 2023-11-18 DIAGNOSIS — M25519 Pain in unspecified shoulder: Secondary | ICD-10-CM | POA: Diagnosis not present

## 2023-11-19 ENCOUNTER — Encounter: Payer: Self-pay | Admitting: Family Medicine

## 2023-11-19 ENCOUNTER — Ambulatory Visit: Admitting: Family Medicine

## 2023-11-19 VITALS — BP 128/78 | HR 92 | Temp 97.8°F | Resp 16 | Ht 65.0 in | Wt 206.0 lb

## 2023-11-19 DIAGNOSIS — F410 Panic disorder [episodic paroxysmal anxiety] without agoraphobia: Secondary | ICD-10-CM | POA: Diagnosis not present

## 2023-11-19 DIAGNOSIS — R0789 Other chest pain: Secondary | ICD-10-CM | POA: Diagnosis not present

## 2023-11-19 MED ORDER — PROPRANOLOL HCL 10 MG PO TABS
ORAL_TABLET | ORAL | 1 refills | Status: AC
Start: 2023-11-19 — End: ?
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
## 2023-11-19 NOTE — Patient Instructions (Addendum)
Heat (pad or rice pillow in microwave) over affected area, 10-15 minutes twice daily.  ° °Ice/cold pack over area for 10-15 min twice daily. ° °OK to take Tylenol 1000 mg (2 extra strength tabs) or 975 mg (3 regular strength tabs) every 6 hours as needed. ° °Let us know if you need anything. ° °Pectoralis Major Rehab °Ask your health care provider which exercises are safe for you. Do exercises exactly as told by your health care provider and adjust them as directed. It is normal to feel mild stretching, pulling, tightness, or discomfort as you do these exercises, but you should stop right away if you feel sudden pain or your pain gets worse. Do not begin these exercises until told by your health care provider. °Stretching and range of motion exercises °These exercises warm up your muscles and joints and improve the movement and flexibility of your shoulder. These exercises can also help to relieve pain, numbness, and tingling. °Exercise A: Pendulum ° °Stand near a wall or a surface that you can hold onto for balance. °Bend at the waist and let your left / right arm hang straight down. Use your other arm to keep your balance. °Relax your arm and shoulder muscles, and move your hips and your trunk so your left / right arm swings freely. Your arm should swing because of the motion of your body, not because you are using your arm or shoulder muscles. °Keep moving so your arm swings in the following directions, as told by your health care provider: °Side to side. °Forward and backward. °In clockwise and counterclockwise circles. °Slowly return to the starting position. °Repeat 2 times. Complete this exercise 3 times per week. °Exercise B: Abduction, standing °Stand and hold a broomstick, a cane, or a similar object. Place your hands a little more than shoulder-width apart on the object. Your left / right hand should be palm-up, and your other hand should be palm-down. °While keeping your elbow straight and your shoulder  muscles relaxed, push the stick across your body toward your left / right side. Raise your left / right arm to the side of your body and then over your head until you feel a stretch in your shoulder. °Stop when you reach the angle that is recommended by your health care provider. °Avoid shrugging your shoulder while you raise your arm. Keep your shoulder blade tucked down toward the middle of your spine. °Hold for 10 seconds. °Slowly return to the starting position. °Repeat 2 times. Complete this exercise 3 times per week. °Exercise C: Wand flexion, supine ° °Lie on your back. You may bend your knees for comfort. °Hold a broomstick, a cane, or a similar object so that your hands are about shoulder-width apart on the object. Your palms should face toward your feet. °Raise your left / right arm in front of your face, then behind your head (toward the floor). Use your other hand to help you do this. Stop when you feel a gentle stretch in your shoulder, or when you reach the angle that is recommended by your health care provider. °Hold for 3 seconds. °Use the broomstick and your other arm to help you return your left / right arm to the starting position. °Repeat 2 times. Complete this exercise 3 times per week. °Exercise D: Wand shoulder external rotation °Stand and hold a broomstick, a cane, or a similar object so your hands are about shoulder-width apart on the object. °Start with your arms hanging down, then bend both   elbows to an "L" shape (90 degrees). °Keep your left / right elbow at your side. Use your other hand to push the stick so your left / right forearm moves away from your body, out to your side. °Keep your left / right elbow bent to 90 degrees and keep it against your side. °Stop when you feel a gentle stretch in your shoulder, or when you reach the angle recommended by your health care provider. °Hold for 10 seconds. °Use the stick to help you return your left / right arm to the starting  position. °Repeat 2 times. Complete this exercise 3 times per week. °Strengthening exercises °These exercises build strength and endurance in your shoulder. Endurance is the ability to use your muscles for a long time, even after your muscles get tired. °Exercise E: Scapular protraction, standing °Stand so you are facing a wall. Place your feet about one arm-length away from the wall. °Place your hands on the wall and straighten your elbows. °Keep your hands on the wall as you push your upper back away from the wall. You should feel your shoulder blades sliding forward. Keep your elbows and your head still. °If you are not sure that you are doing this exercise correctly, ask your health care provider for more instructions. °Hold for 3 seconds. °Slowly return to the starting position. Let your muscles relax completely before you repeat this exercise. °Repeat 2 times. Complete this exercise 3 times per week. °Exercise F: Shoulder blade squeezes  °(scapular retraction) °Sit with good posture in a stable chair. Do not let your back touch the back of the chair. °Your arms should be at your sides with your elbows bent. You may rest your forearms on a pillow if that is more comfortable. °Squeeze your shoulder blades together. Bring them down and back. °Keep your shoulders level. °Do not lift your shoulders up toward your ears. °Hold for 3 seconds. °Return to the starting position. °Repeat 2 times. Complete this exercise 3 times per week. °This information is not intended to replace advice given to you by your health care provider. Make sure you discuss any questions you have with your health care provider. °Document Released: 02/02/2005 Document Revised: 11/14/2015 Document Reviewed: 10/21/2014 °[employer] Interactive Patient Education © 2018 [employer] ° °

## 2024-01-10 DIAGNOSIS — N200 Calculus of kidney: Secondary | ICD-10-CM | POA: Diagnosis not present
